# Patient Record
Sex: Female | Born: 1990 | Race: Black or African American | Hispanic: No | Marital: Single | Smoking: Never smoker
Health system: Southern US, Community
[De-identification: ages and names within clinical notes are randomized; demographics above are authoritative.]

## PROBLEM LIST (undated history)

## (undated) ENCOUNTER — Inpatient Hospital Stay (HOSPITAL_COMMUNITY): Payer: Self-pay

## (undated) ENCOUNTER — Inpatient Hospital Stay: Payer: Self-pay

## (undated) DIAGNOSIS — D561 Beta thalassemia: Principal | ICD-10-CM

## (undated) DIAGNOSIS — R51 Headache: Secondary | ICD-10-CM

## (undated) DIAGNOSIS — R519 Headache, unspecified: Secondary | ICD-10-CM

## (undated) DIAGNOSIS — Z3490 Encounter for supervision of normal pregnancy, unspecified, unspecified trimester: Secondary | ICD-10-CM

## (undated) DIAGNOSIS — Z8619 Personal history of other infectious and parasitic diseases: Secondary | ICD-10-CM

## (undated) DIAGNOSIS — D649 Anemia, unspecified: Secondary | ICD-10-CM

## (undated) HISTORY — PX: CERVICAL BIOPSY  W/ LOOP ELECTRODE EXCISION: SUR135

## (undated) HISTORY — DX: Anemia, unspecified: D64.9

## (undated) HISTORY — DX: Personal history of other infectious and parasitic diseases: Z86.19

## (undated) HISTORY — DX: Beta thalassemia: D56.1

## (undated) HISTORY — DX: Headache, unspecified: R51.9

## (undated) HISTORY — PX: NO PAST SURGERIES: SHX2092

## (undated) HISTORY — DX: Headache: R51

## (undated) HISTORY — DX: Encounter for supervision of normal pregnancy, unspecified, unspecified trimester: Z34.90

---
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
## 2011-05-25 ENCOUNTER — Emergency Department: Payer: Self-pay | Admitting: Emergency Medicine

## 2011-07-13 ENCOUNTER — Emergency Department: Payer: Self-pay | Admitting: *Deleted

## 2011-09-11 ENCOUNTER — Emergency Department: Payer: Self-pay | Admitting: Internal Medicine

## 2011-09-11 LAB — URINALYSIS, COMPLETE
Bacteria: NONE SEEN
Blood: NEGATIVE
Glucose,UR: NEGATIVE mg/dL (ref 0–75)
Leukocyte Esterase: NEGATIVE
Nitrite: NEGATIVE
RBC,UR: 1 /HPF (ref 0–5)
Specific Gravity: 1.034 (ref 1.003–1.030)
Squamous Epithelial: 2
WBC UR: NONE SEEN /HPF (ref 0–5)

## 2011-09-11 LAB — HCG, QUANTITATIVE, PREGNANCY: Beta Hcg, Quant.: 31 m[IU]/mL — ABNORMAL HIGH

## 2011-09-11 LAB — PREGNANCY, URINE: Pregnancy Test, Urine: POSITIVE m[IU]/mL

## 2012-06-27 ENCOUNTER — Emergency Department: Payer: Self-pay | Admitting: Emergency Medicine

## 2012-06-30 LAB — BETA STREP CULTURE(ARMC)

## 2014-02-28 LAB — OB RESULTS CONSOLE ANTIBODY SCREEN: Antibody Screen: NEGATIVE

## 2014-02-28 LAB — OB RESULTS CONSOLE HEPATITIS B SURFACE ANTIGEN: HEP B S AG: NEGATIVE

## 2014-02-28 LAB — OB RESULTS CONSOLE RUBELLA ANTIBODY, IGM: Rubella: IMMUNE

## 2014-02-28 LAB — OB RESULTS CONSOLE ABO/RH: RH TYPE: POSITIVE

## 2014-03-10 ENCOUNTER — Telehealth: Payer: Self-pay | Admitting: Hematology and Oncology

## 2014-03-10 ENCOUNTER — Other Ambulatory Visit: Payer: Self-pay | Admitting: Obstetrics and Gynecology

## 2014-03-10 DIAGNOSIS — E049 Nontoxic goiter, unspecified: Secondary | ICD-10-CM

## 2014-03-10 NOTE — Telephone Encounter (Signed)
S/W PATIENT BOYFRIEND AND GAVE NP APPT FOR 08/07 @ 1:30 W/DR. GORSUCH REFERRING DR. Maisie FusHOMAS HENLEY DX- THALASSEMIA

## 2014-03-24 ENCOUNTER — Ambulatory Visit: Payer: Self-pay | Admitting: Hematology and Oncology

## 2014-03-24 ENCOUNTER — Ambulatory Visit: Payer: Self-pay

## 2014-04-07 ENCOUNTER — Ambulatory Visit (INDEPENDENT_AMBULATORY_CARE_PROVIDER_SITE_OTHER): Payer: Medicaid Other | Admitting: Neurology

## 2014-04-07 ENCOUNTER — Encounter: Payer: Self-pay | Admitting: Neurology

## 2014-04-07 VITALS — BP 102/68 | HR 100 | Resp 16 | Ht 69.75 in | Wt 163.0 lb

## 2014-04-07 DIAGNOSIS — Z3490 Encounter for supervision of normal pregnancy, unspecified, unspecified trimester: Secondary | ICD-10-CM

## 2014-04-07 DIAGNOSIS — G43909 Migraine, unspecified, not intractable, without status migrainosus: Secondary | ICD-10-CM

## 2014-04-07 DIAGNOSIS — R519 Headache, unspecified: Secondary | ICD-10-CM | POA: Insufficient documentation

## 2014-04-07 DIAGNOSIS — R51 Headache: Secondary | ICD-10-CM

## 2014-04-07 MED ORDER — PROMETHAZINE HCL 12.5 MG PO TABS: ORAL_TABLET | ORAL | Status: DC

## 2014-04-07 NOTE — Progress Notes (Signed)
Provider:  Melvyn Novas, M D  Referring Provider: Bing Plume, MD Primary Care Physician:  No primary provider on file.  Chief Complaint  Patient presents with  . New Evaluation    Room 10  [redacted] weeks pregnant  . Headache    HPI:  Dorothy Young is a 23 y.o. single ,afro emrican right ahnded female , mother of 2, female , who is seen here as a referral  from  Her OB /GYN Dr. Ambrose Mantle for a headache  evaluation and treatment in the 17 th week of pregnancy.   Dorothy Young reports that this is her third pregnancy and she has not experienced headaches as part of the previous ones.  She noted in the weeks 8  Through 12 of this pregnancy an entirely new headaches, she has not had before. A left frontal throbbing and pulsating headache, waking her early in the morning . The pain lasts only 3-4 hours if untreated , if she takes medication ( tylenol) this  will resolved quickly. She has not vomited with these HA, but feels nauseated, dizzy and sensitive to lights and sounds. No visual aura.  These headaches have been present for the last 7 days 2 times.   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  She has not been bothered by a lot of  headache in the past.  On no medications.   Review of Systems: Out of a complete 14 system review, the patient complains of only the following symptoms, and all other reviewed systems are negative.  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    History   Social History  . Marital Status: Single    Spouse Name: N/A    Number of Children: 2  . Years of Education: 11   Occupational History  .  Unemployed   Social History Main Topics  . Smoking status: Never Smoker   . Smokeless tobacco: Never Used  . Alcohol Use: No  . Drug Use: No  . Sexual Activity: Not on file   Other Topics Concern  . Not on file   Social History Narrative   Patient is single and lives with her  mother.   Patient has two children.   Patient is not currently not working.   Patient has a high school education.   Patient is right-handed.   Patient drinks very little soda- not everyday.    Family History  Problem Relation Age of Onset  . Diabetes Paternal Grandmother     Past Medical History  Diagnosis Date  . Worsening headaches   . Pregnancy     History reviewed. No pertinent past surgical history.  No current outpatient prescriptions on file.   No current facility-administered medications for this visit.    Allergies as of 04/07/2014  . (Not on File)    Vitals: BP 102/68  Pulse 100  Resp 16  Ht 5' 9.75" (1.772 m)  Wt 163 lb (73.936 kg)  BMI 23.55 kg/m2 Last Weight:  Wt Readings from Last 1 Encounters:  04/07/14 163 lb (73.936 kg)   Last Height:   Ht Readings from Last 1 Encounters:  04/07/14 5' 9.75" (1.772 m)    Physical exam:  General: The patient is awake, alert and appears not in acute distress. The patient is well groomed. Head: Normocephalic, atraumatic. Neck is supple. Mallampati 1 , neck circumference:14  Cardiovascular:  Regular rate  and rhythm , without  murmurs or carotid bruit, and without distended neck veins. Respiratory: Lungs are clear to auscultation. Skin:  Without evidence of edema, or rash Trunk:  normal posture.  Neurologic exam : The patient is awake and alert, oriented to place and time.  Memory subjective  described as intact. There is a normal attention span & concentration ability.  Speech is fluent without  dysarthria, dysphonia or aphasia. Mood and affect are appropriate.  Cranial nerves: Pupils are equal and briskly reactive to light. Funduscopic exam without  evidence of pallor or edema.  Extraocular movements  in vertical and horizontal planes intact and without nystagmus.  Visual fields by finger perimetry are intact. Hearing to finger rub intact.  Facial sensation intact to fine touch. Facial motor strength is  symmetric and tongue and uvula move midline.  Tongue protrusion into either cheek is normal. Shoulder shrug is normal.   Motor exam:   Normal tone ,muscle bulk and symmetric  strength in all extremities. Sensory:  Fine touch, pinprick and vibration were tested in all extremities. Proprioception was normal. Coordination: Rapid alternating movements in the fingers/hands were normal.  Finger-to-nose maneuver  normal without evidence of ataxia, dysmetria or tremor.  Gait and station: Patient walks without assistive device and is able unassisted to climb up to the exam table.  Strength within normal limits. Stance is stable and normal. Tandem gait is unfragmented. Romberg testing is negative   Deep tendon reflexes: in the upper and lower extremities are symmetric and intact. Babinski maneuver response is  downgoing.   Assessment:  After physical and neurologic examination, review of laboratory studies, imaging, neurophysiology testing and pre-existing records, assessment is that of :  Headaches with migrainous character, in pregnancy 17 th. week gestation.  Patient is responding well to just tylenol at this point, will give her prn phenergan for nausea , needs no other pain medication at this point . Hydration and regular meals, small snacks recommended.  Sleep and rest .  Plan:  Treatment plan and additional workup :  Advise to hydrate,  Regular nutrition and exercise, routine sleep and wellness advise.  Tylenol is OK. No need for Tylenol 3.  phenergan prn use.   Porfirio Mylararmen Simmone Cape MD 04/07/2014

## 2014-04-07 NOTE — Patient Instructions (Addendum)
Medicines During Pregnancy During pregnancy, there are medicines that are either safe or unsafe to take. Medicines include prescriptions from your caregiver, over-the-counter medicines, topical creams applied to the skin, and all herbal substances. Medicines are put into either Class A, B, C, or D. Class A and B medicines have been shown to be safe in pregnancy. Class C medicines are also considered to be safe in pregnancy, but these medicines should only be used when necessary. Class D medicines should not be used at all in pregnancy. They can be harmful to a baby.  It is best to take as little medicine as possible while pregnant. However, some medicines are necessary to take for the mother and baby's health. Sometimes, it is more dangerous to stop taking certain medicines than to stay on them. This is often the case for people with long-term (chronic) conditions such as asthma, diabetes, or high blood pressure (hypertension). If you are pregnant and have a chronic illness, call your caregiver right away. Bring a list of your medicines and their doses to your appointments. If you are planning to become pregnant, schedule a doctor's appointment and discuss your medicines with your caregiver. Lastly, write down the phone number to your pharmacist. They can answer questions regarding a medicine's class and safety. They cannot give advice as to whether you should or should not be on a medicine.  SAFE AND UNSAFE MEDICINES There is a long list of medicines that are considered safe in pregnancy. Below is a shorter list. For specific medicines, ask your caregiver.  AllergyMedicines Loratadine, cetirizine, and chlorpheniramine are safe to take. Certain nasal steroid sprays are safe. Talk to your caregiver about specific brands that are safe. Analgesics Acetaminophen and acetaminophen with codeine are safe to take. All other nonsteroidal anti-inflammatory drugs (NSAIDS) are not safe. This includes ibuprofen.   Antacids Many over-the-counter antacids are safe to take. Talk to your caregiver about specific brands that are safe. Famotidine, ranitidine, and lansoprazole are safe. Omepresole is considered safe to take in the second trimester. Antibiotic Medicines There are several antibiotics to avoid. These include, but are not limited to, tetracyline, quinolones, and sulfa medications. Talk to your caregiver before taking any antibiotic.  Antihistamines Talk to your caregiver about specific brands that are safe.  Asthma Medicines Most asthma steroid inhalers are safe to take. Talk to your caregiver for specific details. Calcium Calcium supplements are safe to take. Do not take oyster shell calcium.  Cough and Cold Medicines It is safe to take products with guaifenesin or dextromethorphan. Talk to your caregiver about specific brands that are safe. It is not safe to take products that contain aspirin or ibuprofen. Decongestant Medicines Pseudoephedrine-containing products are safe to take in the second and third trimester.  Depression Medicines Talk about these medicines with your caregiver.  Antidiarrheal Medicines It is safe to take loperamide. Talk to your caregiver about specific brands that are safe. It is not safe to take any antidiarrheal medicine that contains bismuth. Eyedrops Allergy eyedrops should be limited.  Iron It is safe to use certain iron-containing medicines for anemia in pregnancy. They require a prescription.  Antinausea Medicines It is safe to take doxylamine and vitamin B6 as directed. There are other prescription medicines available, if needed.  Sleep aids It is safe to take diphenhydramine and acetaminophen with diphenhydramine.  Steroids Hydrocortisone creams are safe to use as directed. Oral steroids require a prescription. It is not safe to take any hemorrhoid cream with pramoxine or phenylephrine. Stool  softener It is safe to take stool softener  medicines. Avoid daily or prolonged use of stool softeners. Thyroid Medicine It is important to stay on this thyroid medicine. It needs to be followed by your caregiver.  Vaginal Medicines Your caregiver will prescribe a medicine to you if you have a vaginal infection. Certain antifungal medicines are safe to use if you have a sexually transmitted infection (STI). Talk to your caregiver.  Document Released: 08/04/2005 Document Revised: 10/27/2011 Document Reviewed: 08/05/2011 [facility] Patient Information 2015 [place], [place]. This information is not intended to replace advice given to you by your health care provider. Make sure you discuss any questions you have with your health care provider. Promethazine tablets What is this medicine? PROMETHAZINE (proe METH a zeen) is an antihistamine. It is used to treat allergic reactions and to treat or prevent nausea and vomiting from illness or motion sickness. It is also used to make you sleep before surgery, and to help treat pain or nausea after surgery. This medicine may be used for other purposes; ask your health care provider or pharmacist if you have questions. COMMON BRAND NAME(S): Phenergan What should I tell my health care provider before I take this medicine? They need to know if you have any of these conditions: -glaucoma -high blood pressure or heart disease -kidney disease -liver disease -lung or breathing disease, like asthma -prostate trouble -pain or difficulty passing urine -seizures -an unusual or allergic reaction to promethazine or phenothiazines, other medicines, foods, dyes, or preservatives -pregnant or trying to get pregnant -breast-feeding How should I use this medicine? Take this medicine by mouth with a glass of water. Follow the directions on the prescription label. Take your doses at regular intervals. Do not take your medicine more often than directed. Talk to your pediatrician regarding the use of this medicine in  children. Special care may be needed. This medicine should not be given to infants and children younger than 74 years old. Overdosage: If you think you have taken too much of this medicine contact a poison control center or emergency room at once. NOTE: This medicine is only for you. Do not share this medicine with others. What if I miss a dose? If you miss a dose, take it as soon as you can. If it is almost time for your next dose, take only that dose. Do not take double or extra doses. What may interact with this medicine? Do not take this medicine with any of the following medications: -cisapride -dofetilide -dronedarone -MAOIs like Carbex, Eldepryl, Marplan, Nardil, Parnate -pimozide -quinidine, including dextromethorphan; quinidine -thioridazine -ziprasidone This medicine may also interact with the following medications: -certain medicines for depression, anxiety, or psychotic disturbances -certain medicines for anxiety or sleep -certain medicines for seizures like carbamazepine, phenobarbital, phenytoin -certain medicines for movement abnormalities as in Parkinson's disease, or for gastrointestinal problems -epinephrine -medicines for allergies or colds -muscle relaxants -narcotic medicines for pain -other medicines that prolong the QT interval (cause an abnormal heart rhythm) -tramadol -trimethobenzamide This list may not describe all possible interactions. Give your health care provider a list of all the medicines, herbs, non-prescription drugs, or dietary supplements you use. Also tell them if you smoke, drink alcohol, or use illegal drugs. Some items may interact with your medicine. What should I watch for while using this medicine? Tell your doctor or health care professional if your symptoms do not start to get better in 1 to 2 days. You may get drowsy or dizzy. Do not drive, use  machinery, or do anything that needs mental alertness until you know how this medicine affects  you. To reduce the risk of dizzy or fainting spells, do not stand or sit up quickly, especially if you are an older patient. Alcohol may increase dizziness and drowsiness. Avoid alcoholic drinks. Your mouth may get dry. Chewing sugarless gum or sucking hard candy, and drinking plenty of water may help. Contact your doctor if the problem does not go away or is severe. This medicine may cause dry eyes and blurred vision. If you wear contact lenses you may feel some discomfort. Lubricating drops may help. See your eye doctor if the problem does not go away or is severe. This medicine can make you more sensitive to the sun. Keep out of the sun. If you cannot avoid being in the sun, wear protective clothing and use sunscreen. Do not use sun lamps or tanning beds/booths. If you are diabetic, check your blood-sugar levels regularly. What side effects may I notice from receiving this medicine? Side effects that you should report to your doctor or health care professional as soon as possible: -blurred vision -irregular heartbeat, palpitations or chest pain -muscle or facial twitches -pain or difficulty passing urine -seizures -skin rash -slowed or shallow breathing -unusual bleeding or bruising -yellowing of the eyes or skin Side effects that usually do not require medical attention (report to your doctor or health care professional if they continue or are bothersome): -headache -nightmares, agitation, nervousness, excitability, not able to sleep (these are more likely in children) -stuffy nose This list may not describe all possible side effects. Call your doctor for medical advice about side effects. You may report side effects to FDA at 1-800-FDA-1088. Where should I keep my medicine? Keep out of the reach of children. Store at room temperature, between 20 and 25 degrees C (68 and 77 degrees F). Protect from light. Throw away any unused medicine after the expiration date. NOTE: This sheet is a  summary. It may not cover all possible information. If you have questions about this medicine, talk to your doctor, pharmacist, or health care provider.  2015, [employer]vier/Gold Standard. (2013-04-05 15:04:46)

## 2014-05-02 ENCOUNTER — Telehealth: Payer: Self-pay | Admitting: Hematology and Oncology

## 2014-05-02 NOTE — Telephone Encounter (Signed)
S/W PT IN REF TO NP APPT. ON 05/18/14@1 :30 REFERRING DR HENLEY DX-THALASSEMIA

## 2014-05-05 ENCOUNTER — Ambulatory Visit
Admission: RE | Admit: 2014-05-05 | Discharge: 2014-05-05 | Disposition: A | Discharge status: Home / Self Care | Payer: Medicaid Other | Source: Ambulatory Visit | Attending: Obstetrics and Gynecology | Admitting: Obstetrics and Gynecology

## 2014-05-05 DIAGNOSIS — E049 Nontoxic goiter, unspecified: Secondary | ICD-10-CM

## 2014-05-18 ENCOUNTER — Ambulatory Visit (HOSPITAL_BASED_OUTPATIENT_CLINIC_OR_DEPARTMENT_OTHER): Payer: Medicaid Other

## 2014-05-18 ENCOUNTER — Ambulatory Visit (HOSPITAL_BASED_OUTPATIENT_CLINIC_OR_DEPARTMENT_OTHER): Payer: Medicaid Other | Admitting: Hematology and Oncology

## 2014-05-18 ENCOUNTER — Encounter: Payer: Self-pay | Admitting: Hematology and Oncology

## 2014-05-18 ENCOUNTER — Ambulatory Visit: Payer: Medicaid Other

## 2014-05-18 ENCOUNTER — Telehealth: Payer: Self-pay | Admitting: Hematology and Oncology

## 2014-05-18 VITALS — BP 110/62 | HR 94 | Temp 98.2°F | Resp 18 | Ht 69.75 in | Wt 167.3 lb

## 2014-05-18 DIAGNOSIS — D561 Beta thalassemia: Secondary | ICD-10-CM

## 2014-05-18 DIAGNOSIS — O99012 Anemia complicating pregnancy, second trimester: Secondary | ICD-10-CM

## 2014-05-18 DIAGNOSIS — D563 Thalassemia minor: Secondary | ICD-10-CM | POA: Insufficient documentation

## 2014-05-18 DIAGNOSIS — O99019 Anemia complicating pregnancy, unspecified trimester: Secondary | ICD-10-CM | POA: Insufficient documentation

## 2014-05-18 HISTORY — DX: Beta thalassemia: D56.1

## 2014-05-18 LAB — CBC & DIFF AND RETIC
BASO%: 0.3 % (ref 0.0–2.0)
BASOS ABS: 0 10*3/uL (ref 0.0–0.1)
EOS ABS: 0.2 10*3/uL (ref 0.0–0.5)
EOS%: 3.1 % (ref 0.0–7.0)
HEMATOCRIT: 33.2 % — AB (ref 34.8–46.6)
HEMOGLOBIN: 10.9 g/dL — AB (ref 11.6–15.9)
IMMATURE RETIC FRACT: 9 % (ref 1.60–10.00)
LYMPH#: 1.6 10*3/uL (ref 0.9–3.3)
LYMPH%: 25.8 % (ref 14.0–49.7)
MCH: 25.1 pg (ref 25.1–34.0)
MCHC: 32.8 g/dL (ref 31.5–36.0)
MCV: 76.3 fL — ABNORMAL LOW (ref 79.5–101.0)
MONO#: 0.7 10*3/uL (ref 0.1–0.9)
MONO%: 11.8 % (ref 0.0–14.0)
NEUT#: 3.7 10*3/uL (ref 1.5–6.5)
NEUT%: 59 % (ref 38.4–76.8)
PLATELETS: 148 10*3/uL (ref 145–400)
RBC: 4.35 10*6/uL (ref 3.70–5.45)
RDW: 15.5 % — ABNORMAL HIGH (ref 11.2–14.5)
Retic %: 1.12 % (ref 0.70–2.10)
Retic Ct Abs: 48.72 10*3/uL (ref 33.70–90.70)
WBC: 6.2 10*3/uL (ref 3.9–10.3)

## 2014-05-18 LAB — IRON AND TIBC CHCC
%SAT: 38 % (ref 21–57)
IRON: 125 ug/dL (ref 41–142)
TIBC: 330 ug/dL (ref 236–444)
UIBC: 205 ug/dL (ref 120–384)

## 2014-05-18 LAB — MORPHOLOGY: PLT EST: ADEQUATE

## 2014-05-18 LAB — FERRITIN CHCC: Ferritin: 22 ng/ml (ref 9–269)

## 2014-05-18 NOTE — Assessment & Plan Note (Signed)
Clinically she is not symptomatic. I would check an iron panel now and then closer to the third trimester in case she may need iron infusion.

## 2014-05-18 NOTE — Progress Notes (Signed)
[facility] CONSULT NOTE  Patient Care Team: No Pcp Per Patient as PCP - General (General Practice) Bing Plumehomas F Henley, MD as Referring Physician (Obstetrics and Gynecology) Artis DelayNi Paullette Mckain, MD as Consulting Physician (Hematology and Oncology)  CHIEF COMPLAINTS/PURPOSE OF CONSULTATION:  Beta thalassemia  HISTORY OF PRESENTING ILLNESS:  Dorothy Young 23 y.o. 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 She is currently pregnant with her third child and is [redacted] weeks pregnant. She had 2 prior normal pregnancies and delivery. She was found to have abnormal CBC from routine prenatal screening test. She denies recent chest pain on exertion, shortness of breath on minimal exertion, pre-syncopal episodes, or palpitations. She had not noticed any recent bleeding such as epistaxis, hematuria or hematochezia The patient denies over the counter NSAID ingestion. She is not on antiplatelets agents. She never donated blood or received blood transfusion  MEDICAL HISTORY:  Past Medical History  Diagnosis Date  . Worsening headaches   . Pregnancy   . Anemia     thalassemia Beta minor  . 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 No pertinent past surgical history.  SOCIAL HISTORY: History   Social History  . Marital Status: Single    Spouse Name: N/A    Number of Children: 2  . Years of Education: 11   Occupational History  .  Unemployed   Social History Main Topics  . Smoking status: Never Smoker   . Smokeless tobacco: Never Used  . Alcohol Use: No  . Drug Use: No  . Sexual Activity: Not on file   Other Topics Concern  . Not on file   Social History Narrative   Patient is single and lives with her mother.   Patient has two children.   Patient is not currently not working.   Patient has a high school education.   Patient is right-handed.   Patient drinks very little soda- not everyday.    FAMILY HISTORY: Family History  Problem  Relation Age of Onset  . Diabetes Paternal Grandmother   . Cancer Paternal Aunt     unknown cancer    ALLERGIES:  has no allergies on file.  MEDICATIONS:  Current Outpatient Prescriptions  Medication Sig Dispense Refill  . Prenatal Vit-Fe Fumarate-FA (PRENATAL MULTIVITAMIN) TABS tablet Take 1 tablet by mouth daily at 12 noon.      . promethazine (PHENERGAN) 12.5 MG tablet Prn po for nausea.  30 tablet  0   No current facility-administered medications for this visit.    REVIEW OF SYSTEMS:   Constitutional: Denies fevers, chills or abnormal night sweats Eyes: Denies blurriness of vision, double vision or watery eyes Ears, nose, mouth, throat, and face: Denies mucositis or sore throat Respiratory: Denies cough, dyspnea or wheezes Cardiovascular: Denies palpitation, chest discomfort or lower extremity swelling Gastrointestinal:  Denies nausea, heartburn or change in bowel habits Skin: Denies abnormal skin rashes Lymphatics: Denies new lymphadenopathy or easy bruising Neurological:Denies numbness, tingling or new weaknesses Behavioral/Psych: Mood is stable, no new changes  All other systems were reviewed with the patient and are negative.  PHYSICAL EXAMINATION: ECOG PERFORMANCE STATUS: 0 - Asymptomatic  Filed Vitals:   05/18/14 1404  BP: 110/62  Pulse: 94  Temp: 98.2 F (36.8 C)  Resp: 18   Filed Weights   05/18/14 1404  Weight: 167 lb 4.8 oz (75.887 kg)    GENERAL:alert, no distress and comfortable SKIN: skin color, texture, turgor are normal, no rashes or significant lesions EYES:  normal, conjunctiva are pink and non-injected, sclera clear OROPHARYNX:no exudate, no erythema and lips, buccal mucosa, and tongue normal  NECK: supple, thyroid normal size, non-tender, without nodularity LYMPH:  no palpable lymphadenopathy in the cervical, axillary or inguinal LUNGS: clear to auscultation and percussion with normal breathing effort HEART: regular rate & rhythm and no  murmurs and no lower extremity edema ABDOMEN:abdomen soft, non-tender and normal bowel sounds Musculoskeletal:no cyanosis of digits and no clubbing  PSYCH: alert & oriented x 3 with fluent speech NEURO: no focal motor/sensory deficits  LABORATORY DATA:  I have reviewed the data as listed Recent Results (from the past 2160 hour(s))  CBC & DIFF AND RETIC     Status: Abnormal   Collection Time    05/18/14  2:57 PM      Result Value Ref Range   WBC 6.2  3.9 - 10.3 10e3/uL   NEUT# 3.7  1.5 - 6.5 10e3/uL   HGB 10.9 (*) 11.6 - 15.9 g/dL   HCT 11.9 (*) 14.7 - 82.9 %   Platelets 148  145 - 400 10e3/uL   MCV 76.3 (*) 79.5 - 101.0 fL   MCH 25.1  25.1 - 34.0 pg   MCHC 32.8  31.5 - 36.0 g/dL   RBC 5.62  1.30 - 8.65 10e6/uL   RDW 15.5 (*) 11.2 - 14.5 %   lymph# 1.6  0.9 - 3.3 10e3/uL   MONO# 0.7  0.1 - 0.9 10e3/uL   Eosinophils Absolute 0.2  0.0 - 0.5 10e3/uL   Basophils Absolute 0.0  0.0 - 0.1 10e3/uL   NEUT% 59.0  38.4 - 76.8 %   LYMPH% 25.8  14.0 - 49.7 %   MONO% 11.8  0.0 - 14.0 %   EOS% 3.1  0.0 - 7.0 %   BASO% 0.3  0.0 - 2.0 %   Retic % 1.12  0.70 - 2.10 %   Retic Ct Abs 48.72  33.70 - 90.70 10e3/uL   Immature Retic Fract 9.00  1.60 - 10.00 %  IRON AND TIBC [facility]     Status: None   Collection Time    05/18/14  2:57 PM      Result Value Ref Range   Iron 125  41 - 142 ug/dL   TIBC 784  696 - 295 ug/dL   UIBC 284  132 - 440 ug/dL   %SAT 38  21 - 57 %  FERRITIN [facility]     Status: None   Collection Time    05/18/14  2:57 PM      Result Value Ref Range   Ferritin 22  9 - 269 ng/ml  MORPHOLOGY     Status: None   Collection Time    05/18/14  2:57 PM      Result Value Ref Range   Polychromasia Slight  Slight   Ovalocytes Few  Negative   White Cell Comments Variant Lymphs, rare metamyelocte     PLT EST Adequate  Adequate   Platelet Morphology Large Platelets  Within Normal Limits    RADIOGRAPHIC STUDIES: I have personally reviewed the radiological images as listed and agreed  with the findings in the report. US Soft Tissue Head/neck  05/05/2014   CLINICAL DATA:  Goiter.  EXAM: THYROID ULTRASOUND  TECHNIQUE: Ultrasound examination of the thyroid gland and adjacent soft tissues was performed.  COMPARISON:  None.  FINDINGS: Right thyroid lobe  Measurements: 5.2 cm x 2.4 cm x 2.6 cm.  No nodules visualized.  Left thyroid lobe  Measurements: 5.8 cm x 2.1 cm x 2.5 cm. Tiny cystic nodule in the midpole measuring 3 mm. No other nodules visualized.  Isthmus  Thickness: 7 mm.  No nodules visualized.  Lymphadenopathy  None visualized.  IMPRESSION: 1. Mild thyroid enlargement. 2. Tiny cystic nodule measuring 3 mm in the left lobe. No other nodules. 3. No other abnormalities.   Electronically Signed   By: Amie Portland M.D.   On: 05/05/2014 16:58    ASSESSMENT & PLAN:  Thalassemia, beta She is not symptomatic. I recommend she continues on prenatal vitamin. I discussed with her briefly that is a 50% chance that her children may inherit the gene but will likely be asymptomatic unless her partner is also a carrier.  Anemia in pregnancy Clinically she is not symptomatic. I would check an iron panel now and then closer to the third trimester in case she may need iron infusion.    All questions were answered. The patient knows to call the clinic with any problems, questions or concerns. I spent 30 minutes counseling the patient face to face. The total time spent in the appointment was 40 minutes and more than 50% was on counseling.     [facility], Aahan Marques, MD 05/18/2014 6:49 PM

## 2014-05-18 NOTE — Progress Notes (Signed)
Checked in new pt with no financial concerns. °

## 2014-05-18 NOTE — Assessment & Plan Note (Signed)
She is not symptomatic. I recommend she continues on prenatal vitamin. I discussed with her briefly that is a 50% chance that her children may inherit the gene but will likely be asymptomatic unless her partner is also a carrier. 

## 2014-06-15 LAB — OB RESULTS CONSOLE HIV ANTIBODY (ROUTINE TESTING): HIV: NONREACTIVE

## 2014-06-15 LAB — OB RESULTS CONSOLE RPR: RPR: NONREACTIVE

## 2014-06-19 ENCOUNTER — Encounter: Payer: Self-pay | Admitting: Hematology and Oncology

## 2014-08-04 ENCOUNTER — Ambulatory Visit (HOSPITAL_BASED_OUTPATIENT_CLINIC_OR_DEPARTMENT_OTHER): Payer: Medicaid Other | Admitting: Hematology and Oncology

## 2014-08-04 ENCOUNTER — Other Ambulatory Visit (HOSPITAL_BASED_OUTPATIENT_CLINIC_OR_DEPARTMENT_OTHER): Payer: Medicaid Other

## 2014-08-04 ENCOUNTER — Encounter: Payer: Self-pay | Admitting: Hematology and Oncology

## 2014-08-04 VITALS — BP 105/60 | HR 85 | Temp 98.5°F | Resp 20 | Ht 69.75 in | Wt 176.4 lb

## 2014-08-04 DIAGNOSIS — O99013 Anemia complicating pregnancy, third trimester: Secondary | ICD-10-CM

## 2014-08-04 DIAGNOSIS — D561 Beta thalassemia: Secondary | ICD-10-CM

## 2014-08-04 LAB — CBC & DIFF AND RETIC
BASO%: 0.2 % (ref 0.0–2.0)
Basophils Absolute: 0 10*3/uL (ref 0.0–0.1)
EOS ABS: 0.1 10*3/uL (ref 0.0–0.5)
EOS%: 0.9 % (ref 0.0–7.0)
HEMATOCRIT: 34.1 % — AB (ref 34.8–46.6)
HEMOGLOBIN: 11.1 g/dL — AB (ref 11.6–15.9)
Immature Retic Fract: 11 % — ABNORMAL HIGH (ref 1.60–10.00)
LYMPH%: 20.3 % (ref 14.0–49.7)
MCH: 25.2 pg (ref 25.1–34.0)
MCHC: 32.6 g/dL (ref 31.5–36.0)
MCV: 77.3 fL — ABNORMAL LOW (ref 79.5–101.0)
MONO#: 0.8 10*3/uL (ref 0.1–0.9)
MONO%: 9 % (ref 0.0–14.0)
NEUT%: 69.6 % (ref 38.4–76.8)
NEUTROS ABS: 5.9 10*3/uL (ref 1.5–6.5)
PLATELETS: 152 10*3/uL (ref 145–400)
RBC: 4.41 10*6/uL (ref 3.70–5.45)
RDW: 15.6 % — ABNORMAL HIGH (ref 11.2–14.5)
RETIC %: 1.38 % (ref 0.70–2.10)
Retic Ct Abs: 60.86 10*3/uL (ref 33.70–90.70)
WBC: 8.5 10*3/uL (ref 3.9–10.3)
lymph#: 1.7 10*3/uL (ref 0.9–3.3)

## 2014-08-04 LAB — MORPHOLOGY: PLT EST: ADEQUATE

## 2014-08-04 NOTE — Progress Notes (Signed)
[facility] OFFICE PROGRESS NOTE  No PCP Per Patient SUMMARY OF HEMATOLOGIC HISTORY: Beta thalassemia She is here because of diagnosis of thalassemia. She is currently pregnant with her third child and is [redacted] weeks pregnant. She had 2 prior normal pregnancies and delivery. She was found to have abnormal CBC from routine prenatal screening test. She denies recent chest pain on exertion, shortness of breath on minimal exertion, pre-syncopal episodes, or palpitations. She had not noticed any recent bleeding such as epistaxis, hematuria or hematochezia The patient denies over the counter NSAID ingestion. She is not on antiplatelets agents. She never donated blood or received blood transfusion INTERVAL HISTORY: Dorothy Young Dietzman 23 y.o. female returns for further follow-up. She feels fine. The patient denies any recent signs or symptoms of bleeding such as spontaneous epistaxis, hematuria or hematochezia. She tolerated iron supplement well.  I have reviewed the past medical history, past surgical history, social history and family history with the patient and they are unchanged from previous note.  ALLERGIES:  has No Known Allergies.  MEDICATIONS:  Current Outpatient Prescriptions  Medication Sig Dispense Refill  . Prenatal Vit-Fe Fumarate-FA (PRENATAL MULTIVITAMIN) TABS tablet Take 1 tablet by mouth daily at 12 noon.    . promethazine (PHENERGAN) 12.5 MG tablet Prn po for nausea. 30 tablet 0   No current facility-administered medications for this visit.     REVIEW OF SYSTEMS:   Constitutional: Denies fevers, chills or night sweats Eyes: Denies blurriness of vision Ears, nose, mouth, throat, and face: Denies mucositis or sore throat Respiratory: Denies cough, dyspnea or wheezes Cardiovascular: Denies palpitation, chest discomfort or lower extremity swelling Gastrointestinal:  Denies nausea, heartburn or change in bowel habits Skin: Denies abnormal skin  rashes Lymphatics: Denies new lymphadenopathy or easy bruising Neurological:Denies numbness, tingling or new weaknesses Behavioral/Psych: Mood is stable, no new changes  All other systems were reviewed with the patient and are negative.  PHYSICAL EXAMINATION: ECOG PERFORMANCE STATUS: 0 - Asymptomatic  Filed Vitals:   08/04/14 1455  BP: 105/60  Pulse: 85  Temp: 98.5 F (36.9 C)  Resp: 20   Filed Weights   08/04/14 1455  Weight: 176 lb 6.4 oz (80.015 kg)    GENERAL:alert, no distress and comfortable SKIN: skin color, texture, turgor are normal, no rashes or significant lesions EYES: normal, Conjunctiva are pink and non-injected, sclera clear Musculoskeletal:no cyanosis of digits and no clubbing  NEURO: alert & oriented x 3 with fluent speech, no focal motor/sensory deficits  LABORATORY DATA:  I have reviewed the data as listed Results for orders placed or performed in visit on 08/04/14 (from the past 48 hour(s))  CBC & Diff and Retic     Status: Abnormal   Collection Time: 08/04/14  2:35 PM  Result Value Ref Range   WBC 8.5 3.9 - 10.3 10e3/uL   NEUT# 5.9 1.5 - 6.5 10e3/uL   HGB 11.1 (L) 11.6 - 15.9 g/dL   HCT 40.934.1 (L) 81.134.8 - 91.446.6 %   Platelets 152 145 - 400 10e3/uL   MCV 77.3 (L) 79.5 - 101.0 fL   MCH 25.2 25.1 - 34.0 pg   MCHC 32.6 31.5 - 36.0 g/dL   RBC 7.824.41 9.563.70 - 2.135.45 10e6/uL   RDW 15.6 (H) 11.2 - 14.5 %   lymph# 1.7 0.9 - 3.3 10e3/uL   MONO# 0.8 0.1 - 0.9 10e3/uL   Eosinophils Absolute 0.1 0.0 - 0.5 10e3/uL   Basophils Absolute 0.0 0.0 - 0.1 10e3/uL   NEUT% 69.6  38.4 - 76.8 %   LYMPH% 20.3 14.0 - 49.7 %   MONO% 9.0 0.0 - 14.0 %   EOS% 0.9 0.0 - 7.0 %   BASO% 0.2 0.0 - 2.0 %   Retic % 1.38 0.70 - 2.10 %   Retic Ct Abs 60.86 33.70 - 90.70 10e3/uL   Immature Retic Fract 11.00 (H) 1.60 - 10.00 %  Morphology     Status: None   Collection Time: 08/04/14  2:35 PM  Result Value Ref Range   Polychromasia Slight Slight   Tear Drop Cells Few Negative   White Cell  Comments C/W auto diff    PLT EST Adequate Adequate   Platelet Morphology Few Large Platelets Within Normal Limits    Lab Results  Component Value Date   WBC 8.5 08/04/2014   HGB 11.1* 08/04/2014   HCT 34.1* 08/04/2014   MCV 77.3* 08/04/2014   PLT 152 08/04/2014    ASSESSMENT & PLAN:  Thalassemia, beta She is not symptomatic. I recommend she continues on prenatal vitamin. I discussed with her briefly that is a 50% chance that her children may inherit the gene but will likely be asymptomatic unless her partner is also a carrier.  Anemia in pregnancy Clinically she is not symptomatic. Her hemoglobin is higher than previous visit. I recommend she continue on iron supplement.   All questions were answered. The patient knows to call the clinic with any problems, questions or concerns. No barriers to learning was detected.  I spent 15 minutes counseling the patient face to face. The total time spent in the appointment was 20 minutes and more than 50% was on counseling.     [facility] : [facility] HospitalGORSUCH, Tekeshia Klahr, MD 08/04/2014 3:09 PM

## 2014-08-04 NOTE — Assessment & Plan Note (Signed)
Clinically she is not symptomatic. Her hemoglobin is higher than previous visit. I recommend she continue on iron supplement.

## 2014-08-04 NOTE — Assessment & Plan Note (Signed)
She is not symptomatic. I recommend she continues on prenatal vitamin. I discussed with her briefly that is a 50% chance that her children may inherit the gene but will likely be asymptomatic unless her partner is also a carrier.

## 2014-08-07 LAB — IRON AND TIBC CHCC
%SAT: 20 % — ABNORMAL LOW (ref 21–57)
IRON: 75 ug/dL (ref 41–142)
TIBC: 368 ug/dL (ref 236–444)
UIBC: 293 ug/dL (ref 120–384)

## 2014-08-07 LAB — FERRITIN CHCC: FERRITIN: 26 ng/mL (ref 9–269)

## 2014-08-08 ENCOUNTER — Encounter (HOSPITAL_COMMUNITY): Payer: Self-pay | Admitting: *Deleted

## 2014-08-08 ENCOUNTER — Inpatient Hospital Stay (HOSPITAL_COMMUNITY)
Admission: AD | Admit: 2014-08-08 | Discharge: 2014-08-08 | Disposition: A | Discharge status: Home / Self Care | Payer: Medicaid Other | Source: Ambulatory Visit | Attending: Obstetrics and Gynecology | Admitting: Obstetrics and Gynecology

## 2014-08-08 DIAGNOSIS — R109 Unspecified abdominal pain: Secondary | ICD-10-CM | POA: Insufficient documentation

## 2014-08-08 DIAGNOSIS — Z3A34 34 weeks gestation of pregnancy: Secondary | ICD-10-CM | POA: Diagnosis not present

## 2014-08-08 DIAGNOSIS — O9989 Other specified diseases and conditions complicating pregnancy, childbirth and the puerperium: Secondary | ICD-10-CM | POA: Diagnosis not present

## 2014-08-08 DIAGNOSIS — O26899 Other specified pregnancy related conditions, unspecified trimester: Secondary | ICD-10-CM

## 2014-08-08 LAB — URINALYSIS, ROUTINE W REFLEX MICROSCOPIC
Bilirubin Urine: NEGATIVE
Glucose, UA: NEGATIVE mg/dL
Hgb urine dipstick: NEGATIVE
Ketones, ur: NEGATIVE mg/dL
NITRITE: NEGATIVE
Protein, ur: NEGATIVE mg/dL
SPECIFIC GRAVITY, URINE: 1.015 (ref 1.005–1.030)
UROBILINOGEN UA: 4 mg/dL — AB (ref 0.0–1.0)
pH: 7 (ref 5.0–8.0)

## 2014-08-08 LAB — URINE MICROSCOPIC-ADD ON

## 2014-08-08 NOTE — MAU Note (Signed)
Having abd pains all day today.  Went to dr, was told if things got worse, to go back to office.  Hasn't gotten worse, is just bad.  Denies bleeding or leaking, no hx of PTL

## 2014-08-08 NOTE — MAU Provider Note (Signed)
History     CSN: [id number]  Arrival date and time: 08/08/14 1515   First Provider Initiated Contact with Patient 08/08/14 1630      Chief Complaint  Patient presents with  . Abdominal Pain   HPI Pt is [id number] pregnant [id number] and c/o right sided pain that hurt when she walked- lasted 2 to 3 hours-  Pt had an appointment with Dr. Ellyn HackBovard early this afternoon and mentioned the pain and was told to go home and rest- pt was driving and pain became worse, so she came to the hospital.  Pt denies vaginal discharge, leakage of fluid or spotting/bleeding, Constipation or diarrhea Past Medical History  Diagnosis Date  . Worsening headaches   . Pregnancy   . Anemia     thalassemia Beta minor  . Thalassemia, beta 05/18/2014    Past Surgical History  Procedure Laterality Date  . No past surgeries      Family History  Problem Relation Age of Onset  . Diabetes Paternal Grandmother   . Cancer Paternal Aunt     unknown cancer    History  Substance Use Topics  . Smoking status: Never Smoker   . Smokeless tobacco: Never Used  . Alcohol Use: No    Allergies: No Known Allergies  Prescriptions prior to admission  Medication Sig Dispense Refill Last Dose  . Prenatal Vit-Fe Fumarate-FA (PRENATAL MULTIVITAMIN) TABS tablet Take 1 tablet by mouth daily at 12 noon.   08/08/2014 at Unknown time  . promethazine (PHENERGAN) 12.5 MG tablet Prn po for nausea. 30 tablet 0 Past Month at Unknown time    Review of Systems  Constitutional: Negative for fever and chills.  Gastrointestinal: Positive for abdominal pain. Negative for nausea and vomiting.  Genitourinary: Negative for dysuria.   Physical Exam   Blood pressure 127/80, pulse 108, temperature 99 F (37.2 C), temperature source Oral, resp. rate 18, weight 174 lb (78.926 kg).  Physical Exam  Nursing note and vitals reviewed. Constitutional: She is oriented to person, place, and time. She appears well-developed and well-nourished. No  distress.  HENT:  Head: Normocephalic.  Eyes: Pupils are equal, round, and reactive to light.  Neck: Normal range of motion.  Cardiovascular: Normal rate.   Respiratory: Effort normal.  GI: Soft. She exhibits no distension. There is no tenderness. There is no rebound and no guarding.  Genitourinary:  Cervix closed posterior  Musculoskeletal: Normal range of motion.  Neurological: She is alert and oriented to person, place, and time.  Skin: Skin is warm and dry.  Psychiatric: She has a normal mood and affect.    MAU Course  Procedures Results for orders placed or performed during the hospital encounter of 08/08/14 (from the past 24 hour(s))  Urinalysis, Routine w reflex microscopic     Status: Abnormal   Collection Time: 08/08/14  3:35 PM  Result Value Ref Range   Color, Urine YELLOW YELLOW   APPearance CLEAR CLEAR   Specific Gravity, Urine 1.015 1.005 - 1.030   pH 7.0 5.0 - 8.0   Glucose, UA NEGATIVE NEGATIVE mg/dL   Hgb urine dipstick NEGATIVE NEGATIVE   Bilirubin Urine NEGATIVE NEGATIVE   Ketones, ur NEGATIVE NEGATIVE mg/dL   Protein, ur NEGATIVE NEGATIVE mg/dL   Urobilinogen, UA 4.0 (H) 0.0 - 1.0 mg/dL   Nitrite NEGATIVE NEGATIVE   Leukocytes, UA SMALL (A) NEGATIVE  Urine microscopic-add on     Status: Abnormal   Collection Time: 08/08/14  3:35 PM  Result Value Ref Range  Squamous Epithelial / LPF FEW (A) RARE   WBC, UA 0-2 <3 WBC/hpf   RBC / HPF 0-2 <3 RBC/hpf   Bacteria, UA RARE RARE  NST reactive; pain resolved  Discussed with Dr. Jackelyn KnifeMeisinger Assessment and Plan  Abdominal pain in pregnancy F/u with OB appointment- return sooner if increase in pain  Dorothy Young 08/08/2014, 4:32 PM

## 2014-08-09 NOTE — Progress Notes (Signed)
FHT from 12-22 reviewed.  Reactive NST, irreg ctx.

## 2014-08-15 LAB — OB RESULTS CONSOLE GC/CHLAMYDIA
Chlamydia: NEGATIVE
Gonorrhea: NEGATIVE

## 2014-08-15 LAB — OB RESULTS CONSOLE GBS: STREP GROUP B AG: NEGATIVE

## 2014-08-18 NOTE — L&D Delivery Note (Signed)
Delivery Note At 5:07 PM a viable female was delivered via Vaginal, Spontaneous Delivery (Presentation: face; Mentum Anterior).  APGAR: 8, 9; weight  pending.   Placenta status: Intact, Spontaneous.  Cord: 3 vessels with the following complications: None.  Anesthesia: None  Episiotomy: None Lacerations: Vaginal Suture Repair: 3.0 vicryl rapide Est. Blood Loss (mL): 300  Mom to postpartum.  Baby to Couplet care / Skin to Skin.  Lilybelle Mayeda D 08/30/2014, 5:28 PM

## 2014-08-30 ENCOUNTER — Encounter (HOSPITAL_COMMUNITY): Payer: Self-pay | Admitting: General Practice

## 2014-08-30 ENCOUNTER — Inpatient Hospital Stay (HOSPITAL_COMMUNITY)
Admission: AD | Admit: 2014-08-30 | Discharge: 2014-09-01 | DRG: 775 | Disposition: A | Discharge status: Home / Self Care | Payer: Medicaid Other | Source: Ambulatory Visit | Attending: Obstetrics and Gynecology | Admitting: Obstetrics and Gynecology

## 2014-08-30 DIAGNOSIS — O323XX0 Maternal care for face, brow and chin presentation, not applicable or unspecified: Secondary | ICD-10-CM | POA: Clinically undetermined

## 2014-08-30 DIAGNOSIS — D563 Thalassemia minor: Secondary | ICD-10-CM | POA: Diagnosis present

## 2014-08-30 DIAGNOSIS — Z3A37 37 weeks gestation of pregnancy: Secondary | ICD-10-CM | POA: Diagnosis present

## 2014-08-30 DIAGNOSIS — O42013 Preterm premature rupture of membranes, onset of labor within 24 hours of rupture, third trimester: Secondary | ICD-10-CM | POA: Diagnosis present

## 2014-08-30 DIAGNOSIS — D561 Beta thalassemia: Secondary | ICD-10-CM

## 2014-08-30 LAB — CBC
HCT: 37.5 % (ref 36.0–46.0)
Hemoglobin: 12.5 g/dL (ref 12.0–15.0)
MCH: 25.7 pg — ABNORMAL LOW (ref 26.0–34.0)
MCHC: 33.3 g/dL (ref 30.0–36.0)
MCV: 77.2 fL — ABNORMAL LOW (ref 78.0–100.0)
PLATELETS: 143 10*3/uL — AB (ref 150–400)
RBC: 4.86 MIL/uL (ref 3.87–5.11)
RDW: 15.5 % (ref 11.5–15.5)
WBC: 9.4 10*3/uL (ref 4.0–10.5)

## 2014-08-30 LAB — TYPE AND SCREEN
ABO/RH(D): O POS
ANTIBODY SCREEN: NEGATIVE

## 2014-08-30 MED ORDER — CITRIC ACID-SODIUM CITRATE 334-500 MG/5ML PO SOLN: 30.0000 mL | ORAL | Status: DC | PRN

## 2014-08-30 MED ORDER — DIPHENHYDRAMINE HCL 25 MG PO CAPS: 25.0000 mg | ORAL_CAPSULE | Freq: Four times a day (QID) | ORAL | Status: DC | PRN

## 2014-08-30 MED ORDER — ONDANSETRON HCL 4 MG PO TABS: 4.0000 mg | ORAL_TABLET | ORAL | Status: DC | PRN

## 2014-08-30 MED ORDER — WITCH HAZEL-GLYCERIN EX PADS: 1.0000 "application " | MEDICATED_PAD | CUTANEOUS | Status: DC | PRN

## 2014-08-30 MED ORDER — LIDOCAINE HCL (PF) 1 % IJ SOLN
30.0000 mL | INTRAMUSCULAR | Status: DC | PRN
Start: 2014-08-30 — End: 2014-09-01
  Administered 2014-08-30: 30 mL via SUBCUTANEOUS
  Filled 2014-08-30: qty 30

## 2014-08-30 MED ORDER — ONDANSETRON HCL 4 MG/2ML IJ SOLN: 4.0000 mg | Freq: Four times a day (QID) | INTRAMUSCULAR | Status: DC | PRN

## 2014-08-30 MED ORDER — MAGNESIUM HYDROXIDE 400 MG/5ML PO SUSP: 30.0000 mL | ORAL | Status: DC | PRN

## 2014-08-30 MED ORDER — SENNOSIDES-DOCUSATE SODIUM 8.6-50 MG PO TABS
2.0000 | ORAL_TABLET | ORAL | Status: DC
  Administered 2014-08-30: 2 via ORAL
  Filled 2014-08-30: qty 2

## 2014-08-30 MED ORDER — OXYCODONE-ACETAMINOPHEN 5-325 MG PO TABS
1.0000 | ORAL_TABLET | ORAL | Status: DC | PRN
  Administered 2014-08-30 – 2014-09-01 (×3): 1 via ORAL
  Filled 2014-08-30 (×3): qty 1

## 2014-08-30 MED ORDER — METHYLERGONOVINE MALEATE 0.2 MG/ML IJ SOLN: 0.2000 mg | INTRAMUSCULAR | Status: DC | PRN

## 2014-08-30 MED ORDER — BUTORPHANOL TARTRATE 1 MG/ML IJ SOLN: 1.0000 mg | INTRAMUSCULAR | Status: DC | PRN

## 2014-08-30 MED ORDER — IBUPROFEN 600 MG PO TABS
600.0000 mg | ORAL_TABLET | Freq: Four times a day (QID) | ORAL | Status: DC
  Administered 2014-08-30 – 2014-09-01 (×7): 600 mg via ORAL
  Filled 2014-08-30 (×7): qty 1

## 2014-08-30 MED ORDER — ONDANSETRON HCL 4 MG/2ML IJ SOLN: 4.0000 mg | INTRAMUSCULAR | Status: DC | PRN

## 2014-08-30 MED ORDER — METHYLERGONOVINE MALEATE 0.2 MG PO TABS: 0.2000 mg | ORAL_TABLET | ORAL | Status: DC | PRN

## 2014-08-30 MED ORDER — PRENATAL MULTIVITAMIN CH
1.0000 | ORAL_TABLET | Freq: Every day | ORAL | Status: DC
  Administered 2014-08-31: 1 via ORAL
  Filled 2014-08-30: qty 1

## 2014-08-30 MED ORDER — ZOLPIDEM TARTRATE 5 MG PO TABS: 5.0000 mg | ORAL_TABLET | Freq: Every evening | ORAL | Status: DC | PRN

## 2014-08-30 MED ORDER — LACTATED RINGERS IV SOLN: 500.0000 mL | INTRAVENOUS | Status: DC | PRN

## 2014-08-30 MED ORDER — DIBUCAINE 1 % RE OINT: 1.0000 "application " | TOPICAL_OINTMENT | RECTAL | Status: DC | PRN

## 2014-08-30 MED ORDER — LACTATED RINGERS IV SOLN
INTRAVENOUS | Status: DC
  Administered 2014-08-30: 11:00:00 via INTRAVENOUS

## 2014-08-30 MED ORDER — OXYTOCIN BOLUS FROM INFUSION
500.0000 mL | INTRAVENOUS | Status: DC
  Administered 2014-08-30: 500 mL via INTRAVENOUS

## 2014-08-30 MED ORDER — MEASLES, MUMPS & RUBELLA VAC ~~LOC~~ INJ: 0.5000 mL | INJECTION | Freq: Once | SUBCUTANEOUS | Status: DC

## 2014-08-30 MED ORDER — OXYTOCIN 40 UNITS IN LACTATED RINGERS INFUSION - SIMPLE MED: 62.5000 mL/h | INTRAVENOUS | Status: DC

## 2014-08-30 MED ORDER — SIMETHICONE 80 MG PO CHEW: 80.0000 mg | CHEWABLE_TABLET | ORAL | Status: DC | PRN

## 2014-08-30 MED ORDER — OXYCODONE-ACETAMINOPHEN 5-325 MG PO TABS: 1.0000 | ORAL_TABLET | ORAL | Status: DC | PRN

## 2014-08-30 MED ORDER — TETANUS-DIPHTH-ACELL PERTUSSIS 5-2.5-18.5 LF-MCG/0.5 IM SUSP: 0.5000 mL | Freq: Once | INTRAMUSCULAR | Status: DC

## 2014-08-30 MED ORDER — BENZOCAINE-MENTHOL 20-0.5 % EX AERO: 1.0000 "application " | INHALATION_SPRAY | CUTANEOUS | Status: DC | PRN

## 2014-08-30 MED ORDER — OXYCODONE-ACETAMINOPHEN 5-325 MG PO TABS: 2.0000 | ORAL_TABLET | ORAL | Status: DC | PRN

## 2014-08-30 MED ORDER — LANOLIN HYDROUS EX OINT: TOPICAL_OINTMENT | CUTANEOUS | Status: DC | PRN

## 2014-08-30 MED ORDER — OXYTOCIN 40 UNITS IN LACTATED RINGERS INFUSION - SIMPLE MED
1.0000 m[IU]/min | INTRAVENOUS | Status: DC
  Administered 2014-08-30: 2 m[IU]/min via INTRAVENOUS
  Filled 2014-08-30: qty 1000

## 2014-08-30 MED ORDER — TERBUTALINE SULFATE 1 MG/ML IJ SOLN
0.2500 mg | Freq: Once | INTRAMUSCULAR | Status: DC | PRN
  Filled 2014-08-30: qty 1

## 2014-08-30 NOTE — H&P (Signed)
Dorothy Young QualeMonshea Hult is a 24 y.o. female, G4 37P2012, [facility] 37[facility].  Eval in MAU confirmed ROM with + fern and leak, no regular ctx.  Prenatal care essentially uncomplicated, Beta-thal minor, see prenatal records for complete history.  Maternal Medical History:  Reason for admission: Rupture of membranes.   Contractions: Frequency: irregular.   Perceived severity is mild.    Fetal activity: Perceived fetal activity is normal.    Prenatal complications: no prenatal complications   OB History    Gravida Para Term Preterm AB TAB SAB Ectopic Multiple Living   4 2 2  1  1   2     SVD at term x 2  Past Medical History  Diagnosis Date  . Worsening headaches   . Pregnancy   . Anemia     thalassemia Beta minor  . Thalassemia, beta 05/18/2014   Past Surgical History  Procedure Laterality Date  . No past surgeries     Family History: family history includes Cancer in her paternal aunt; Diabetes in her paternal grandmother. Social History:  reports that she has never smoked. She has never used smokeless tobacco. She reports that she does not drink alcohol or use illicit drugs.   Prenatal Transfer Tool  Maternal Diabetes: No Genetic Screening: Normal Maternal Ultrasounds/Referrals: Normal Fetal Ultrasounds or other Referrals:  None Maternal Substance Abuse:  No Significant Maternal Medications:  None Significant Maternal Lab Results:  Lab values include: Group B Strep negative Other Comments:  Beta thalassemia minor  Review of Systems  Respiratory: Negative.   Cardiovascular: Negative.     Dilation: 3 Effacement (%): 90 Station: -1 Exam by:: L Lamon RN Blood pressure 112/62, pulse 97, temperature 98.7 F (37.1 C), temperature source Oral, resp. rate 18, height 5\' 10"  (1.778 m), weight 81.194 kg (179 lb). Maternal Exam:  Uterine Assessment: Contraction strength is mild.  Contraction frequency is irregular.   Abdomen: Patient reports  no abdominal tenderness. Estimated fetal weight is 6 1/2 lbs.   Fetal presentation: vertex  Introitus: Normal vulva. Normal vagina.  Ferning test: positive.  Amniotic fluid character: clear.  Pelvis: adequate for delivery.   Cervix: Cervix evaluated by digital exam.     Fetal Exam Fetal Monitor Review: Mode: ultrasound.   Baseline rate: 130.  Variability: moderate (6-25 bpm).   Pattern: accelerations present and no decelerations.    Fetal State Assessment: Category I - tracings are normal.     Physical Exam  Vitals reviewed. Constitutional: She appears well-developed and well-nourished.  Cardiovascular: Normal rate, regular rhythm and normal heart sounds.   No murmur heard. Respiratory: Effort normal and breath sounds normal. No respiratory distress. She has no wheezes.  GI: Soft.    Prenatal labs: ABO, Rh: O/Positive/-- (07/14 0000) Antibody: Negative (07/14 0000) Rubella: Immune (07/14 0000) RPR: Nonreactive (10/29 0000)  HBsAg: Negative (07/14 0000)  HIV: Non-reactive (10/29 0000)  GBS: Negative (12/29 0000)  GCT:  87  Assessment/Plan: IUP at 37+ weeks with PROM.  Will augment with pitocin and monitor progress.   Tanairy Payeur D 08/30/2014, 1:19 PM

## 2014-08-30 NOTE — MAU Note (Addendum)
contracting all night, tried to sleep, but they would wake her ~about every 10 min. No bleeding.   Started leaking watery, clear fluid around 0400. Was 3 when last checked.

## 2014-08-31 LAB — RPR: RPR Ser Ql: NONREACTIVE

## 2014-08-31 NOTE — Progress Notes (Signed)
PPD #1 No problems Afeb, VSS Fundus firm, NT at U-1 Continue routine postpartum care 

## 2014-08-31 NOTE — Progress Notes (Signed)
UR chart review completed.  

## 2014-09-01 MED ORDER — OXYCODONE-ACETAMINOPHEN 5-325 MG PO TABS: 1.0000 | ORAL_TABLET | Freq: Four times a day (QID) | ORAL | Status: DC | PRN

## 2014-09-01 MED ORDER — IBUPROFEN 600 MG PO TABS: 600.0000 mg | ORAL_TABLET | Freq: Four times a day (QID) | ORAL | Status: DC | PRN
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
## 2014-09-01 NOTE — Progress Notes (Signed)
Patient ID: Dorothy Young, female   DOB: 17-Jul-1991, 24 y.o.   MRN: [id number] #2 afebrile BP normal for d/c

## 2014-09-01 NOTE — Discharge Instructions (Signed)
booklet °

## 2014-09-11 ENCOUNTER — Inpatient Hospital Stay (HOSPITAL_COMMUNITY): Admission: RE | Admit: 2014-09-11 | Payer: Medicaid Other | Source: Ambulatory Visit

## 2015-12-06 ENCOUNTER — Emergency Department (HOSPITAL_COMMUNITY): Payer: Self-pay

## 2015-12-06 ENCOUNTER — Encounter (HOSPITAL_COMMUNITY): Payer: Self-pay | Admitting: Emergency Medicine

## 2015-12-06 ENCOUNTER — Emergency Department (HOSPITAL_COMMUNITY)
Admission: EM | Admit: 2015-12-06 | Discharge: 2015-12-06 | Disposition: A | Discharge status: Home / Self Care | Payer: Self-pay | Attending: Emergency Medicine | Admitting: Emergency Medicine

## 2015-12-06 DIAGNOSIS — Y9389 Activity, other specified: Secondary | ICD-10-CM | POA: Insufficient documentation

## 2015-12-06 DIAGNOSIS — Z79899 Other long term (current) drug therapy: Secondary | ICD-10-CM | POA: Insufficient documentation

## 2015-12-06 DIAGNOSIS — Z862 Personal history of diseases of the blood and blood-forming organs and certain disorders involving the immune mechanism: Secondary | ICD-10-CM | POA: Insufficient documentation

## 2015-12-06 DIAGNOSIS — Y998 Other external cause status: Secondary | ICD-10-CM | POA: Insufficient documentation

## 2015-12-06 DIAGNOSIS — Y92410 Unspecified street and highway as the place of occurrence of the external cause: Secondary | ICD-10-CM | POA: Insufficient documentation

## 2015-12-06 DIAGNOSIS — Z041 Encounter for examination and observation following transport accident: Secondary | ICD-10-CM | POA: Insufficient documentation

## 2015-12-06 LAB — POC URINE PREG, ED: Preg Test, Ur: NEGATIVE

## 2015-12-06 MED ORDER — DIAZEPAM 5 MG PO TABS: 5.0000 mg | ORAL_TABLET | Freq: Two times a day (BID) | ORAL | Status: DC

## 2015-12-06 MED ORDER — NAPROXEN 250 MG PO TABS
500.0000 mg | ORAL_TABLET | Freq: Once | ORAL | Status: AC
  Administered 2015-12-06: 500 mg via ORAL
  Filled 2015-12-06: qty 2

## 2015-12-06 MED ORDER — NAPROXEN 500 MG PO TABS: 500.0000 mg | ORAL_TABLET | Freq: Two times a day (BID) | ORAL | Status: DC

## 2015-12-06 MED ORDER — DIAZEPAM 5 MG PO TABS
5.0000 mg | ORAL_TABLET | Freq: Once | ORAL | Status: DC
  Filled 2015-12-06: qty 1
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
## 2015-12-06 NOTE — Progress Notes (Signed)
Orthopedic Tech Progress Note Patient Details:  Dorothy Young Mar 06, 1991 [id number]  Ortho Devices Type of Ortho Device: Soft collar Ortho Device/Splint Location: neck Ortho Device/Splint Interventions: Ordered, Application   Jennye MoccasinHughes, Yariana Hoaglund Craig 12/06/2015, 10:28 PM
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
## 2015-12-29 ENCOUNTER — Emergency Department (HOSPITAL_COMMUNITY)
Admission: EM | Admit: 2015-12-29 | Discharge: 2015-12-29 | Disposition: A | Discharge status: Home / Self Care | Payer: No Typology Code available for payment source | Attending: Emergency Medicine | Admitting: Emergency Medicine

## 2015-12-29 ENCOUNTER — Encounter (HOSPITAL_COMMUNITY): Payer: Self-pay | Admitting: Emergency Medicine

## 2015-12-29 DIAGNOSIS — K0889 Other specified disorders of teeth and supporting structures: Secondary | ICD-10-CM

## 2015-12-29 DIAGNOSIS — Z79899 Other long term (current) drug therapy: Secondary | ICD-10-CM | POA: Insufficient documentation

## 2015-12-29 DIAGNOSIS — F419 Anxiety disorder, unspecified: Secondary | ICD-10-CM | POA: Insufficient documentation

## 2015-12-29 DIAGNOSIS — Z862 Personal history of diseases of the blood and blood-forming organs and certain disorders involving the immune mechanism: Secondary | ICD-10-CM | POA: Insufficient documentation

## 2015-12-29 DIAGNOSIS — K047 Periapical abscess without sinus: Secondary | ICD-10-CM | POA: Insufficient documentation

## 2015-12-29 DIAGNOSIS — H9201 Otalgia, right ear: Secondary | ICD-10-CM | POA: Insufficient documentation

## 2015-12-29 MED ORDER — PENICILLIN V POTASSIUM 250 MG PO TABS
500.0000 mg | ORAL_TABLET | Freq: Once | ORAL | Status: AC
  Administered 2015-12-29: 500 mg via ORAL
  Filled 2015-12-29: qty 2

## 2015-12-29 MED ORDER — ACETAMINOPHEN 325 MG PO TABS
650.0000 mg | ORAL_TABLET | Freq: Once | ORAL | Status: AC
  Administered 2015-12-29: 650 mg via ORAL
  Filled 2015-12-29: qty 2

## 2015-12-29 MED ORDER — PENICILLIN V POTASSIUM 500 MG PO TABS: 500.0000 mg | ORAL_TABLET | Freq: Four times a day (QID) | ORAL | Status: DC

## 2015-12-29 MED ORDER — NAPROXEN 250 MG PO TABS: 250.0000 mg | ORAL_TABLET | Freq: Two times a day (BID) | ORAL | Status: DC

## 2015-12-29 NOTE — Discharge Instructions (Signed)
Dental Abscess °A dental abscess is a collection of pus in or around a tooth. °CAUSES °This condition is caused by a bacterial infection around the root of the tooth that involves the inner part of the tooth (pulp). It may result from: °· Severe tooth decay. °· Trauma to the tooth that allows bacteria to enter into the pulp, such as a broken or chipped tooth. °· Severe gum disease around a tooth. °SYMPTOMS °Symptoms of this condition include: °· Severe pain in and around the infected tooth. °· Swelling and redness around the infected tooth, in the mouth, or in the face. °· Tenderness. °· Pus drainage. °· Bad breath. °· Bitter taste in the mouth. °· Difficulty swallowing. °· Difficulty opening the mouth. °· Nausea. °· Vomiting. °· Chills. °· Swollen neck glands. °· Fever. °DIAGNOSIS °This condition is diagnosed with examination of the infected tooth. During the exam, your dentist may tap on the infected tooth. Your dentist will also ask about your medical and dental history and may order X-rays. °TREATMENT °This condition is treated by eliminating the infection. This may be done with: °· Antibiotic medicine. °· A root canal. This may be performed to save the tooth. °· Pulling (extracting) the tooth. This may also involve draining the abscess. This is done if the tooth cannot be saved. °HOME CARE INSTRUCTIONS °· Take medicines only as directed by your dentist. °· If you were prescribed antibiotic medicine, finish all of it even if you start to feel better. °· Rinse your mouth (gargle) often with salt water to relieve pain or swelling. °· Do not drive or operate heavy machinery while taking pain medicine. °· Do not apply heat to the outside of your mouth. °· Keep all follow-up visits as directed by your dentist. This is important. °SEEK MEDICAL CARE IF: °· Your pain is worse and is not helped by medicine. °SEEK IMMEDIATE MEDICAL CARE IF: °· You have a fever or chills. °· Your symptoms suddenly get worse. °· You have a  very bad headache. °· You have problems breathing or swallowing. °· You have trouble opening your mouth. °· You have swelling in your neck or around your eye. °  °This information is not intended to replace advice given to you by your health care provider. Make sure you discuss any questions you have with your health care provider. °  °Document Released: 08/04/2005 Document Revised: 12/19/2014 Document Reviewed: 08/01/2014 °Elsevier Interactive Patient Education ©2016 Elsevier Inc. °Community Resource Guide Dental °The United Way’s “211” is a great source of information about community services available.  Access by dialing 2-1-1 from anywhere in , or by website -  www.nc211.org.  ° °Other Local Resources (Updated 08/2015) ° °Dental  Care °  °Services ° °  °Phone Number and Address  °Cost  °[facility] For children 0 - 21 years of age:  °• Cleaning °• Tooth brushing/flossing instruction °• Sealants, fillings, crowns °• Extractions °• Emergency treatment  [phone] °[address] °[place], [place] [address] Charges based on family income.  Medicaid and some insurance plans accepted.   °  °[facility] [place] • Cleaning °• Sealants, fillings, crowns °• Extractions °• Emergency treatment [phone] °[address] °[place], [place] ° Pregnant women 18 years of age or older with a Medicaid card  °[facility] [place] • Cleaning °• Sealants, fillings, crowns °• Extractions °• Emergency treatment [phone] °[address] °[place], [place] Pregnant   women 18 years of age or older with a Medicaid card  °[facility] For children 0 - 21 years of age:  °• Cleaning °• Tooth brushing/flossing instruction °• Sealants, fillings, crowns °• Extractions °• Emergency treatment °Limited orthodontic services for patients with Medicaid [phone] °[address] °[place], [place] [address] Medicaid and [place] Health Choice cover for children up to age 21 and pregnant women.  Parents of children up to age 21 without Medicaid pay a reduced fee at time of service.  °[facility] [place] For children 0 - 21 years of age:  °• Cleaning °• Tooth brushing/flossing instruction °• Sealants, fillings, crowns °• Extractions °• Emergency treatment °Limited orthodontic services for patients with Medicaid [phone] °[address] °[place], [place].  Medicaid and [place] Health Choice cover for children up to age 21 and pregnant women.  Parents of children up to age 21 without Medicaid pay a reduced fee.  °[facility] • Cleaning °• Sealants, fillings, crowns °• Extractions ° °Hours: Tuesdays and Thursdays, 4:15 - 8 pm [phone] °[address] °[place], [place] [address] Services free of charge to [place] residents ages 18-64 who do not have health insurance, Medicare, Medicaid, or VA benefits and fall within federal poverty guidelines  °[facility] ° ° ° Provides dental care in addition to primary medical care, nutritional counseling, and pharmacy: °• Cleaning °• Sealants, fillings, crowns °• Extractions ° ° ° ° ° ° ° ° ° ° ° ° ° ° ° ° ° [phone] °[facility], [address] °[place], [place] ° °[phone] °[facility], [address] [place], [place] ° °[phone] °[facility] °[place], [place] ° °[phone] °[facility], [address] °[place], [place] ° °[phone] °[facility] °[address] °[place], [place] Accepts Medicaid, Medicare, most insurance.  Also provides services available to all with fees adjusted based on ability to pay.    °[facility] • Cleaning °• Tooth brushing/flossing  instruction °• Sealants, fillings, crowns °• Extractions °• Emergency treatment °Hours: Tuesdays, Thursdays, and Fridays from 8 am to 5 pm by appointment only. [phone] °[address] °[place], [place] [address] [place] residents with Medicaid (depending on eligibility) and children with [place] Health Choice - call for more information.  °[facility] • Extractions only ° °Hours: 2nd and 4th Thursday of each month from 6:30 am - 9 am.   [phone] ext. [phone] °[address] °[place], [place] [address] Ages 18 and older only.  Patients are seen on a first come, first served basis.  °[facility] • Cleanings °• Fillings °• Extractions °• Orthodontics °• Endodontics °• Implants/Crowns/Bridges °• Complete and partial dentures [phone] °[place], [place] Patients must complete an application for services.  There is often a waiting list.   ° °

## 2015-12-29 NOTE — ED Notes (Signed)
Started last pm with right lower dental pain. Woke today with facial swelling,

## 2015-12-29 NOTE — ED Provider Notes (Signed)
CSN: [id number]     Arrival date & time 12/29/15  1343 History  By signing my name below, I, Dorothy Young, attest that this documentation has been prepared under the direction and in the presence of Dorothy Farrier, PA-C. Electronically Signed: Tanda Young, ED Scribe. 12/29/2015. 1:58 PM.   Chief Complaint  Patient presents with  . Dental Pain   The history is provided by the patient. No language interpreter was used.    HPI Comments: Dorothy Young is a 25 y.o. female who presents to the Emergency Department complaining of gradual onset, constant, 8/10, right lower dental pain that began 3 days ago. Pt also complains of right sided facial swelling that began yesterday, right ear pain, and a headache. She has been taking Aspirin without relief and took ibuprofen approximately 2 hours ago. Pt is able to open and close her jaw. She has been staying hydrated but has not been eating as much due to the pain. Pt does not have a dentist at the moment nor does she have dental insurance. Denies fever, chills, drainage from teeth, neck stiffness, chest pain, shortness of breath, abdominal pain, nausea, vomiting, diarrhea, or any other associated symptoms.   Past Medical History  Diagnosis Date  . Worsening headaches   . Pregnancy   . Anemia     thalassemia Beta minor  . Thalassemia, beta (HCC) 05/18/2014   Past Surgical History  Procedure Laterality Date  . No past surgeries     Family History  Problem Relation Age of Onset  . Diabetes Paternal Grandmother   . Cancer Paternal Aunt     unknown cancer   Social History  Substance Use Topics  . Smoking status: Never Smoker   . Smokeless tobacco: Never Used  . Alcohol Use: No   OB History    Gravida Para Term Preterm AB TAB SAB Ectopic Multiple Living   0 1     Review of Systems  Constitutional: Negative for fever and chills.  HENT: Positive for dental problem, ear pain (right) and facial swelling. Negative for  drooling, sore throat, trouble swallowing and voice change.   Eyes: Negative for visual disturbance.  Respiratory: Negative for shortness of breath.   Cardiovascular: Negative for chest pain.  Gastrointestinal: Negative for nausea, vomiting, abdominal pain and diarrhea.  Musculoskeletal: Negative for neck pain and neck stiffness.  Skin: Negative for rash.  Neurological: Positive for headaches. Negative for dizziness and light-headedness.   Allergies  Review of patient's allergies indicates no known allergies.  Home Medications   Prior to Admission medications   Medication Sig Start Date End Date Taking? Authorizing Provider  diazepam (VALIUM) 5 MG tablet Take 1 tablet (5 mg total) by mouth 2 (two) times daily. 12/06/15   Melton Krebs, PA-C  ibuprofen (ADVIL,MOTRIN) 600 MG tablet Take 1 tablet (600 mg total) by mouth every 6 (six) hours as needed. 09/01/14   Tracey Harries, MD  naproxen (NAPROSYN) 250 MG tablet Take 1 tablet (250 mg total) by mouth 2 (two) times daily with a meal. 12/29/15   Dorothy Farrier, PA-C  oxyCODONE-acetaminophen (PERCOCET/ROXICET) 5-325 MG per tablet Take 1 tablet by mouth every 6 (six) hours as needed (for pain scale less than 7). 09/01/14   Tracey Harries, MD  penicillin v potassium (VEETID) 500 MG tablet Take 1 tablet (500 mg total) by mouth 4 (four) times daily. 12/29/15   Dorothy Farrier, PA-C   BP 130/67 mmHg  Pulse  118  Temp(Src) 99.1 F (37.3 C) (Oral)  Resp 18  SpO2 99%  LMP 11/15/2015 (Approximate)   Physical Exam  Constitutional: She appears well-developed and well-nourished. No distress.  Non-toxic appearing.   HENT:  Head: Normocephalic and atraumatic.  Right Ear: External ear normal.  Left Ear: External ear normal.  Mouth/Throat: Oropharynx is clear and moist. No oropharyngeal exudate.  No discharge from the mouth. Swelling to right lower jaw.  Uvula is midline without edema. Soft palate rises symmetrically. No tonsillar hypertrophy or  exudates. Tongue protrusion is normal. No pain beneath tongue.  Swelling, tenderness, and induration to right lower gumline. No fluctuant abscess to drain. No trismus.  Bilateral tympanic membranes are pearly-gray without erythema or loss of landmarks.   Eyes: Conjunctivae are normal. Pupils are equal, round, and reactive to light. Right eye exhibits no discharge. Left eye exhibits no discharge.  Neck: Normal range of motion. Neck supple. No JVD present. No tracheal deviation present.  Cardiovascular: Normal rate, normal heart sounds and intact distal pulses.   HR is 112.   Pulmonary/Chest: Effort normal and breath sounds normal. No stridor. No respiratory distress.  Abdominal: Soft. There is no tenderness.  Lymphadenopathy:    She has no cervical adenopathy.  Neurological: She is alert. No cranial nerve deficit. Coordination normal.  Skin: Skin is warm and dry. No rash noted. She is not diaphoretic. No erythema. No pallor.  Psychiatric: Her behavior is normal. Her mood appears anxious.  Patient appears anxious.   Nursing note and vitals reviewed.   ED Course  Procedures (including critical care time)  DIAGNOSTIC STUDIES: Oxygen Saturation is 99% on RA, normal by my interpretation.    COORDINATION OF CARE: 1:56 PM-Discussed treatment plan which includes Rx antibiotics with pt at bedside and pt agreed to plan.    MDM   Meds given in ED:  Medications  acetaminophen (TYLENOL) tablet 650 mg (not administered)  penicillin v potassium (VEETID) tablet 500 mg (not administered)    New Prescriptions   NAPROXEN (NAPROSYN) 250 MG TABLET    Take 1 tablet (250 mg total) by mouth 2 (two) times daily with a meal.   PENICILLIN V POTASSIUM (VEETID) 500 MG TABLET    Take 1 tablet (500 mg total) by mouth 4 (four) times daily.    Final diagnoses:  Pain, dental  Dental abscess   This is a 25 y.o. female who presents to the Emergency Department complaining of gradual onset, constant, 8/10,  right lower dental pain that began 3 days ago. Pt also complains of right sided facial swelling that began yesterday, right ear pain, and a headache. She has been taking Aspirin without relief and took ibuprofen approximately 2 hours ago. Pt is able to open and close her jaw. She has been staying hydrated but has not been eating as much due to the pain. Patient with dentalgia to right lower jaw with right lateral edema to mandible.   No fluctuant abscess that I would be able to incision and drainage.   Exam not concerning for Ludwig's angina or pharyngeal abscess.  Will treat with Penicillin and Naprosyn. Pt instructed to follow-up with dentist and provided with dental resource guide.  Discussed return precautions. Pt safe for discharge. I discussed strict and specific return precautions. I advised the patient to follow-up with their primary care provider this week. I advised the patient to return to the emergency department with new or worsening symptoms or new concerns. The patient verbalized understanding and agreement with  plan.    I personally performed the services described in this documentation, which was scribed in my presence. The recorded information has been reviewed and is accurate.          Dorothy FarrierWilliam Junko Ohagan, PA-C 12/29/15 1407  Loren Raceravid Yelverton, MD 12/31/15 [phone]

## 2016-06-03 ENCOUNTER — Encounter: Payer: Self-pay | Admitting: Obstetrics and Gynecology

## 2016-06-05 ENCOUNTER — Ambulatory Visit (INDEPENDENT_AMBULATORY_CARE_PROVIDER_SITE_OTHER): Payer: Medicaid Other | Admitting: Obstetrics and Gynecology

## 2016-06-05 ENCOUNTER — Other Ambulatory Visit: Payer: Self-pay | Admitting: Obstetrics and Gynecology

## 2016-06-05 ENCOUNTER — Encounter: Payer: Self-pay | Admitting: Obstetrics and Gynecology

## 2016-06-05 ENCOUNTER — Other Ambulatory Visit (INDEPENDENT_AMBULATORY_CARE_PROVIDER_SITE_OTHER): Payer: Medicaid Other

## 2016-06-05 VITALS — BP 101/65 | HR 88 | Wt 154.3 lb

## 2016-06-05 DIAGNOSIS — Z3682 Encounter for antenatal screening for nuchal translucency: Secondary | ICD-10-CM

## 2016-06-05 DIAGNOSIS — O3680X0 Pregnancy with inconclusive fetal viability, not applicable or unspecified: Secondary | ICD-10-CM | POA: Diagnosis not present

## 2016-06-05 DIAGNOSIS — Z113 Encounter for screening for infections with a predominantly sexual mode of transmission: Secondary | ICD-10-CM

## 2016-06-05 DIAGNOSIS — N920 Excessive and frequent menstruation with regular cycle: Secondary | ICD-10-CM

## 2016-06-05 DIAGNOSIS — N939 Abnormal uterine and vaginal bleeding, unspecified: Secondary | ICD-10-CM

## 2016-06-05 DIAGNOSIS — O2691 Pregnancy related conditions, unspecified, first trimester: Secondary | ICD-10-CM

## 2016-06-05 DIAGNOSIS — Z3689 Encounter for other specified antenatal screening: Secondary | ICD-10-CM

## 2016-06-05 DIAGNOSIS — Z369 Encounter for antenatal screening, unspecified: Secondary | ICD-10-CM

## 2016-06-05 DIAGNOSIS — Z8619 Personal history of other infectious and parasitic diseases: Secondary | ICD-10-CM | POA: Insufficient documentation

## 2016-06-05 DIAGNOSIS — Z87898 Personal history of other specified conditions: Secondary | ICD-10-CM

## 2016-06-05 DIAGNOSIS — E049 Nontoxic goiter, unspecified: Secondary | ICD-10-CM

## 2016-06-05 DIAGNOSIS — Z8742 Personal history of other diseases of the female genital tract: Secondary | ICD-10-CM

## 2016-06-05 NOTE — Patient Instructions (Signed)

## 2016-06-05 NOTE — Progress Notes (Signed)
OBSTETRIC INITIAL PRENATAL VISIT  Subjective:    Dorothy Young is being seen today for her first obstetrical visit.  This is not a planned pregnancy. Patient had pregnancy confirmation at [facility]. She is a 25 y.o. [id number] female [redacted]w[redacted]d gestation, Estimated Date of Delivery: 12/16/2016 by last menstrual period of 03/11/2016 (although patient is unsure of her dates).  Her obstetrical history is significant for none. Relationship with FOB: significant other, living together. Patient does not intend to breast feed. Pregnancy history fully reviewed.    Obstetric History   G5   P3   T3   P0   A1   L3    SAB0   TAB0   Ectopic0   Multiple0   Live Births3     # Outcome Date GA Lbr Len/2nd Weight Sex Delivery Anes PTL Lv  5 Current           4 Term 08/30/14 [redacted]w[redacted]d 01:44 / 00:23 6 lb 13 oz (3.09 kg) M Vag-Spont None  LIV     Apgar1:  8                Apgar5: 9  3 AB 2012 [redacted]w[redacted]d    TAB     2 Term 07/22/10 [redacted]w[redacted]d  6 lb 8 oz (2.948 kg) F Vag-Spont None N LIV  1 Term 09/15/08 [redacted]w[redacted]d  6 lb 7 oz (2.92 kg) F Vag-Spont None N LIV      Gynecologic History:  Last pap smear was 2015.  Results were abnormal (LGSIL).  Denies prior h/o abnormal pap smears in the past.  Admits to history of STIs: Chlamydia in 2010, false positive RPR in 2009 pregnancy, (treated x 1 dose for possible syphilis).    Past Medical History:  Diagnosis Date  . Thalassemia, beta (HCC) 05/18/2014  . Worsening headaches     Family History  Problem Relation Age of Onset  . Diabetes Paternal Grandmother   . Cancer Paternal Aunt     unknown cancer    Past Surgical History:  Procedure Laterality Date  . NO PAST SURGERIES      Social History   Social History  . Marital status: Single    Spouse name: N/A  . Number of children: 2  . Years of education: 11   Occupational History  .  Unemployed   Social History Main Topics  . Smoking status: Never Smoker  . Smokeless tobacco: Never Used  . Alcohol use No  . Drug use:  No  . Sexual activity: Yes   Other Topics Concern  . Not on file   Social History Narrative   Patient is single and lives with her mother.   Patient has two children.   Patient is not currently not working.   Patient has a high school education.   Patient is right-handed.   Patient drinks very little soda- not everyday.    No current outpatient prescriptions on file prior to visit.   No current facility-administered medications on file prior to visit.     No Known Allergies   Review of Systems General:Not Present- Fever, Weight Loss and Weight Gain. Skin:Not Present- Rash. HEENT:Not Present- Blurred Vision, Headache and Bleeding Gums. Respiratory:Not Present- Difficulty Breathing. Breast:Not Present- Breast Mass. Cardiovascular:Not Present- Chest Pain, Elevated Blood Pressure, Fainting / Blacking Out and Shortness of Breath. Gastrointestinal:Not Present- Abdominal Pain, Constipation, Nausea and Vomiting. Female Genitourinary:Not Present- Frequency, Painful Urination, Pelvic Pain, Vaginal Bleeding, Vaginal Discharge, Contractions, regular, Fetal Movements Decreased, Urinary Complaints and  Vaginal Fluid. Musculoskeletal:Not Present- Back Pain and Leg Cramps. Neurological:Not Present- Dizziness. Psychiatric:Not Present- Depression.     Objective:   Blood pressure 101/65, pulse 88, weight 154 lb 4.8 oz (70 kg), last menstrual period 03/11/2016, unknown if currently breastfeeding.  Body mass index is 22.14 kg/m.   General Appearance:    Alert, cooperative, no distress, appears stated age  Head:    Normocephalic, without obvious abnormality, atraumatic  Eyes:    PERRL, conjunctiva/corneas clear, EOM's intact, both eyes  Ears:    Normal external ear canals, both ears  Nose:   Nares normal, septum midline, mucosa normal, no drainage or sinus tenderness  Throat:   Lips, mucosa, and tongue normal; teeth and gums normal  Neck:   Supple, symmetrical, trachea midline,  no adenopathy; thyroid: goiter present.  No tenderness/nodules; no carotid bruit or JVD  Back:     Symmetric, no curvature, ROM normal, no CVA tenderness  Lungs:     Clear to auscultation bilaterally, respirations unlabored  Chest Wall:    No tenderness or deformity   Heart:    Regular rate and rhythm, S1 and S2 normal, no murmur, rub or gallop  Breast Exam:    No tenderness, masses, or nipple abnormality  Abdomen:     Soft, non-tender, bowel sounds active all four quadrants, no masses, no organomegaly.  FHT: unable to obtain on Dopplers.  Genitalia:    Pelvic:external genitalia normal, vagina without lesions, or tenderness, rectovaginal septum normal. Dark brown blood in vaginal vault. Cervix normal in appearance, no cervical motion tenderness, no adnexal masses or tenderness.  Pregnancy positive findings: uterine enlargement: 8-10 wk size, nontender.   Rectal:    Normal external sphincter.  No hemorrhoids appreciated. Internal exam not done.   Extremities:   Extremities normal, atraumatic, no cyanosis or edema  Pulses:   2+ and symmetric all extremities  Skin:   Skin color, texture, turgor normal, no rashes or lesions  Lymph nodes:   Cervical, supraclavicular, and axillary nodes normal  Neurologic:   CNII-XII intact, normal strength, sensation and reflexes throughout       Assessment:    Pregnancy at 12 and 2/7 weeks   Size < dates, with unsure LMP Vaginal spotting  Previous h/o STI H/o LGSIL pap Goiter   Plan:   Initial labs ordered. Pap smear performed today.  Prenatal vitamins encouraged. Problem list reviewed and updated. New OB counseling:  The patient has been given an overview regarding routine prenatal care.  Recommendations regarding diet, weight gain, and exercise in pregnancy were given. Prenatal testing, optional genetic testing, and ultrasound use in pregnancy were reviewed.  AFP3 discussed: requested. Benefits of Breast Feeding were discussed. The patient is  encouraged to consider nursing her baby post partum. Goiter present, will check thyroid studies. Follow up in 4 weeks.  50% of 30 min visit spent on counseling and coordination of care.       Addendum 06/05/16 at 10:32 a.m.:  ULTRASOUND REPORT  Location: [facility] Date of Service: 06/05/16  Indications:Dating and Viability Findings:  Intrauterine Gestational sac is visualized with a  consistent with 7 1/[redacted] weeks gestation, giving an (U/S) EDD of 01/21/17. The (U/S) EDD is NOT consistent with the clinically established (LMP) EDD of 12/16/16.  FHR: not seen Fetal pole not seen. .  Right Ovary measures 3.2 x 2.1 x 2.4 cm. It is normal in appearance. Left Ovary measures 3.9 x 2.4 x 2.2 cm. It is normal appearance. There is evidence  of a corpus luteal cyst in the Left Survey of the adnexa demonstrates no adnexal masses. There is no free peritoneal fluid in the cul de sac.  Impression: 1. 7 1/7 week  Intrauterine Gestational sac by U/S. 2. (U/S) EDD is NOT consistent with Clinically established (LMP) EDD of 12/16/16. 3. No Fetal pole seen.  Recommendations: 1.Clinical correlation with the patient's History and Physical Exam. 2. Return in 1 week for follow up US   Patient to have serial BHCGs, and repeat ultrasound in 1 week.     Hildred Laser, MD [facility]

## 2016-06-06 ENCOUNTER — Other Ambulatory Visit: Payer: Self-pay | Admitting: Hematology and Oncology

## 2016-06-06 LAB — URINE CULTURE

## 2016-06-06 LAB — MICROSCOPIC EXAMINATION
Bacteria, UA: NONE SEEN
Casts: NONE SEEN /LPF
Epithelial Cells (non renal): >10 /HPF — AB

## 2016-06-06 LAB — MONITOR DRUG PROFILE 14(MW)
Amphetamine Scrn, Ur: NEGATIVE ng/mL
BARBITURATE SCREEN URINE: NEGATIVE ng/mL
BENZODIAZEPINE SCREEN, URINE: NEGATIVE ng/mL
BUPRENORPHINE, URINE: NEGATIVE ng/mL
CANNABINOIDS UR QL SCN: NEGATIVE ng/mL
CREATININE(CRT), U: 117.1 mg/dL (ref 20.0–300.0)
Cocaine (Metab) Scrn, Ur: NEGATIVE ng/mL
FENTANYL, URINE: NEGATIVE pg/mL
METHADONE SCREEN, URINE: NEGATIVE ng/mL
Meperidine Screen, Urine: NEGATIVE ng/mL
OPIATE SCREEN URINE: NEGATIVE ng/mL
OXYCODONE+OXYMORPHONE UR QL SCN: NEGATIVE ng/mL
Ph of Urine: 5.9 (ref 4.5–8.9)
Phencyclidine Qn, Ur: NEGATIVE ng/mL
Propoxyphene Scrn, Ur: NEGATIVE ng/mL
SPECIFIC GRAVITY: 1.018
TRAMADOL SCREEN, URINE: NEGATIVE ng/mL

## 2016-06-06 LAB — URINALYSIS, ROUTINE W REFLEX MICROSCOPIC
BILIRUBIN UA: NEGATIVE
Glucose, UA: NEGATIVE
Ketones, UA: NEGATIVE
LEUKOCYTES UA: NEGATIVE
Nitrite, UA: NEGATIVE
PH UA: 6.5 (ref 5.0–7.5)
PROTEIN UA: NEGATIVE
Specific Gravity, UA: 1.018 (ref 1.005–1.030)
Urobilinogen, Ur: 0.2 mg/dL (ref 0.2–1.0)

## 2016-06-06 LAB — ABO AND RH: RH TYPE: POSITIVE

## 2016-06-06 LAB — BETA HCG QUANT (REF LAB): hCG Quant: 7125 m[IU]/mL

## 2016-06-07 LAB — CBC WITH DIFFERENTIAL/PLATELET
BASOS ABS: 0 10*3/uL (ref 0.0–0.2)
BASOS: 1 %
EOS (ABSOLUTE): 0.2 10*3/uL (ref 0.0–0.4)
Eos: 4 %
Hematocrit: 36.4 % (ref 34.0–46.6)
Hemoglobin: 12.1 g/dL (ref 11.1–15.9)
IMMATURE GRANULOCYTES: 1 %
Immature Grans (Abs): 0 10*3/uL (ref 0.0–0.1)
Lymphocytes Absolute: 1.8 10*3/uL (ref 0.7–3.1)
Lymphs: 44 %
MCH: 25.9 pg — ABNORMAL LOW (ref 26.6–33.0)
MCHC: 33.2 g/dL (ref 31.5–35.7)
MCV: 78 fL — AB (ref 79–97)
Monocytes Absolute: 0.5 10*3/uL (ref 0.1–0.9)
Monocytes: 12 %
NEUTROS PCT: 38 %
Neutrophils Absolute: 1.6 10*3/uL (ref 1.4–7.0)
PLATELETS: 172 10*3/uL (ref 150–379)
RBC: 4.67 x10E6/uL (ref 3.77–5.28)
RDW: 16.8 % — AB (ref 12.3–15.4)
WBC: 4.2 10*3/uL (ref 3.4–10.8)

## 2016-06-07 LAB — SICKLE CELL SCREEN: Sickle Cell Screen: NEGATIVE

## 2016-06-07 LAB — ANTIBODY SCREEN: ANTIBODY SCREEN: NEGATIVE

## 2016-06-07 LAB — THYROID PANEL WITH TSH
Free Thyroxine Index: 3 (ref 1.2–4.9)
T3 Uptake Ratio: 26 % (ref 24–39)
T4 TOTAL: 11.4 ug/dL (ref 4.5–12.0)
TSH: 0.695 u[IU]/mL (ref 0.450–4.500)

## 2016-06-07 LAB — RUBELLA SCREEN: RUBELLA: 3.01 {index} (ref >0.99)

## 2016-06-07 LAB — RPR: RPR: NONREACTIVE

## 2016-06-07 LAB — HEPATITIS B SURFACE ANTIGEN: Hepatitis B Surface Ag: NEGATIVE

## 2016-06-07 LAB — HIV ANTIBODY (ROUTINE TESTING W REFLEX): HIV Screen 4th Generation wRfx: NONREACTIVE

## 2016-06-07 LAB — VARICELLA ZOSTER ANTIBODY, IGG: Varicella zoster IgG: 1513 index (ref >165)

## 2016-06-08 ENCOUNTER — Inpatient Hospital Stay (HOSPITAL_COMMUNITY): Payer: Medicaid Other

## 2016-06-08 ENCOUNTER — Inpatient Hospital Stay (HOSPITAL_COMMUNITY)
Admission: AD | Admit: 2016-06-08 | Discharge: 2016-06-08 | Discharge status: Against Medical Advice | Payer: Medicaid Other | Source: Ambulatory Visit | Attending: Obstetrics and Gynecology | Admitting: Obstetrics and Gynecology

## 2016-06-08 ENCOUNTER — Encounter (HOSPITAL_COMMUNITY): Payer: Self-pay

## 2016-06-08 DIAGNOSIS — O200 Threatened abortion: Secondary | ICD-10-CM

## 2016-06-08 DIAGNOSIS — O4691 Antepartum hemorrhage, unspecified, first trimester: Secondary | ICD-10-CM | POA: Diagnosis present

## 2016-06-08 DIAGNOSIS — Z3A01 Less than 8 weeks gestation of pregnancy: Secondary | ICD-10-CM | POA: Insufficient documentation

## 2016-06-08 DIAGNOSIS — O209 Hemorrhage in early pregnancy, unspecified: Secondary | ICD-10-CM

## 2016-06-08 LAB — URINALYSIS, ROUTINE W REFLEX MICROSCOPIC
BILIRUBIN URINE: NEGATIVE
Glucose, UA: NEGATIVE mg/dL
KETONES UR: NEGATIVE mg/dL
Leukocytes, UA: NEGATIVE
Nitrite: NEGATIVE
PH: 6 (ref 5.0–8.0)
Protein, ur: NEGATIVE mg/dL

## 2016-06-08 LAB — URINE MICROSCOPIC-ADD ON: WBC, UA: NONE SEEN WBC/hpf (ref 0–5)

## 2016-06-08 LAB — CBC
HEMATOCRIT: 29.5 % — AB (ref 36.0–46.0)
HEMOGLOBIN: 10.1 g/dL — AB (ref 12.0–15.0)
MCH: 26 pg (ref 26.0–34.0)
MCHC: 34.2 g/dL (ref 30.0–36.0)
MCV: 76 fL — AB (ref 78.0–100.0)
PLATELETS: 142 10*3/uL — AB (ref 150–400)
RBC: 3.88 MIL/uL (ref 3.87–5.11)
RDW: 15.3 % (ref 11.5–15.5)
WBC: 8.9 10*3/uL (ref 4.0–10.5)

## 2016-06-08 LAB — HCG, QUANTITATIVE, PREGNANCY: hCG, Beta Chain, Quant, S: 2836 m[IU]/mL — ABNORMAL HIGH (ref <5)

## 2016-06-08 NOTE — MAU Note (Signed)
Patient presents to mau with c/o vaginal bleeding x1 week that started as spotting and increased to that of a "period". Endorses going through 4 pads today. +HPT; LMP July 25th. Reporting lower abdominal pain that is cramping in nature that started at the same time; tried tylenol for pain with no relief. Also states that she "passed out" around an hour ago at home; states she remembers feeling hot and woke up on the floor.

## 2016-06-08 NOTE — Discharge Instructions (Signed)

## 2016-06-08 NOTE — MAU Provider Note (Signed)
History     CSN: [id number]  Arrival date and time: 06/08/16 2037   First Provider Initiated Contact with Patient 06/08/16 2105      Chief Complaint  Patient presents with  . Vaginal Bleeding   Vaginal Bleeding  The patient's primary symptoms include pelvic pain and vaginal bleeding. This is a new problem. The current episode started today. The problem occurs intermittently. The problem has been gradually worsening. Pain severity now: 6/10. The problem affects both sides. She is pregnant. Associated symptoms include abdominal pain. Pertinent negatives include no chills, constipation, diarrhea, dysuria, fever, frequency, nausea, urgency or vomiting. The vaginal discharge was bloody. The vaginal bleeding is typical of menses. She has been passing clots (about the size of a plum.). She has not been passing tissue. Nothing aggravates the symptoms. She has tried acetaminophen for the symptoms. The treatment provided no relief. Her menstrual history has been regular (LMP 03/11/16 ).    Past Medical History:  Diagnosis Date  . Anemia    thalassemia Beta minor  . H/O chlamydia infection 2011  . Pregnancy   . Thalassemia, beta (HCC) 05/18/2014  . Worsening headaches     Past Surgical History:  Procedure Laterality Date  . NO PAST SURGERIES      Family History  Problem Relation Age of Onset  . Diabetes Paternal Grandmother   . Cancer Paternal Aunt     unknown cancer    Social History  Substance Use Topics  . Smoking status: Never Smoker  . Smokeless tobacco: Never Used  . Alcohol use No    Allergies: No Known Allergies  Prescriptions Prior to Admission  Medication Sig Dispense Refill Last Dose  . acetaminophen (TYLENOL) 325 MG tablet Take 650 mg by mouth every 6 (six) hours as needed.   06/08/2016 at 1500  . Prenatal Vit-Fe Fumarate-FA (MULTIVITAMIN-PRENATAL) 27-0.8 MG TABS tablet Take 1 tablet by mouth daily at 12 noon.   06/07/2016 at Unknown time    Review of Systems   Constitutional: Negative for chills and fever.  Gastrointestinal: Positive for abdominal pain. Negative for constipation, diarrhea, nausea and vomiting.  Genitourinary: Positive for pelvic pain and vaginal bleeding. Negative for dysuria, frequency and urgency.   Physical Exam   Blood pressure 123/68, pulse 118, temperature 98.1 F (36.7 C), temperature source Oral, resp. rate 16, weight 153 lb 1.3 oz (69.4 kg), last menstrual period 03/11/2016, SpO2 100 %, unknown if currently breastfeeding.  Physical Exam  Nursing note and vitals reviewed. Constitutional: She is oriented to person, place, and time. She appears well-developed and well-nourished. No distress.  HENT:  Head: Normocephalic.  Cardiovascular: Normal rate.   Respiratory: Effort normal.  GI: Soft. There is no tenderness. There is no rebound.  Genitourinary:  Genitourinary Comments: Scant amount of blood on pad   Neurological: She is alert and oriented to person, place, and time.  Skin: Skin is warm and dry.  Psychiatric: She has a normal mood and affect.   Results for orders placed or performed during the hospital encounter of 06/08/16 (from the past 24 hour(s))  Urinalysis, Routine w reflex microscopic (not at [facility])     Status: Abnormal   Collection Time: 06/08/16  8:43 PM  Result Value Ref Range   Color, Urine YELLOW YELLOW   APPearance CLEAR CLEAR   Specific Gravity, Urine <1.005 (L) 1.005 - 1.030   pH 6.0 5.0 - 8.0   Glucose, UA NEGATIVE NEGATIVE mg/dL   Hgb urine dipstick LARGE (A) NEGATIVE  Bilirubin Urine NEGATIVE NEGATIVE   Ketones, ur NEGATIVE NEGATIVE mg/dL   Protein, ur NEGATIVE NEGATIVE mg/dL   Nitrite NEGATIVE NEGATIVE   Leukocytes, UA NEGATIVE NEGATIVE  Urine microscopic-add on     Status: Abnormal   Collection Time: 06/08/16  8:43 PM  Result Value Ref Range   Squamous Epithelial / LPF 0-5 (A) NONE SEEN   WBC, UA NONE SEEN 0 - 5 WBC/hpf   RBC / HPF 6-30 0 - 5 RBC/hpf   Bacteria, UA RARE (A) NONE  SEEN  hCG, quantitative, pregnancy     Status: Abnormal   Collection Time: 06/08/16  9:16 PM  Result Value Ref Range   hCG, Beta Chain, Quant, S 2,836 (H) <5 mIU/mL  CBC     Status: Abnormal   Collection Time: 06/08/16  9:18 PM  Result Value Ref Range   WBC 8.9 4.0 - 10.5 K/uL   RBC 3.88 3.87 - 5.11 MIL/uL   Hemoglobin 10.1 (L) 12.0 - 15.0 g/dL   HCT 16.1 (L) 09.6 - 04.5 %   MCV 76.0 (L) 78.0 - 100.0 fL   MCH 26.0 26.0 - 34.0 pg   MCHC 34.2 30.0 - 36.0 g/dL   RDW 40.9 81.1 - 91.4 %   Platelets 142 (L) 150 - 400 K/uL    MAU Course  Procedures  MDM 2346: Patient states that she needs to leave. [place] has not been read by radiology at this time. Reviewed bleeding precautions. Patient has appointment in the morning at [facility]. Advised patient to go to this appointment so that they can look up her [place] results for her. Advised that miscarriage seems likely.   Assessment and Plan   1. Threatened abortion in first trimester   2. Vaginal bleeding in pregnancy, first trimester    Patient left AMA.  Bleeding precautions RX: none  Return to MAU as needed FU with OB as planned  Follow-up Information    [facility] .   Contact information: [address]  [address] [place] [address] [phone]           Tawnya Crook 06/08/2016, 9:08 PM

## 2016-06-12 ENCOUNTER — Ambulatory Visit (INDEPENDENT_AMBULATORY_CARE_PROVIDER_SITE_OTHER): Payer: Medicaid Other

## 2016-06-12 DIAGNOSIS — Z3689 Encounter for other specified antenatal screening: Secondary | ICD-10-CM | POA: Diagnosis not present

## 2016-06-12 LAB — PAP IG, CT-NG, RFX HPV ASCU
CHLAMYDIA, NUC. ACID AMP: NEGATIVE
Gonococcus by Nucleic Acid Amp: NEGATIVE
PAP Smear Comment: 0

## 2016-06-13 LAB — NUSWAB VAGINITIS PLUS (VG+)
CHLAMYDIA TRACHOMATIS, NAA: NEGATIVE
Candida albicans, NAA: NEGATIVE
Candida glabrata, NAA: POSITIVE — AB
NEISSERIA GONORRHOEAE, NAA: NEGATIVE
Trich vag by NAA: NEGATIVE

## 2016-06-18 ENCOUNTER — Encounter: Payer: Self-pay | Admitting: Obstetrics and Gynecology

## 2016-06-18 ENCOUNTER — Ambulatory Visit (INDEPENDENT_AMBULATORY_CARE_PROVIDER_SITE_OTHER): Payer: Medicaid Other | Admitting: Obstetrics and Gynecology

## 2016-06-18 VITALS — BP 99/61 | HR 109 | Ht 69.75 in | Wt 155.4 lb

## 2016-06-18 DIAGNOSIS — O039 Complete or unspecified spontaneous abortion without complication: Secondary | ICD-10-CM | POA: Diagnosis not present

## 2016-06-18 DIAGNOSIS — Z30011 Encounter for initial prescription of contraceptive pills: Secondary | ICD-10-CM | POA: Diagnosis not present

## 2016-06-18 DIAGNOSIS — B373 Candidiasis of vulva and vagina: Secondary | ICD-10-CM

## 2016-06-18 DIAGNOSIS — Z8742 Personal history of other diseases of the female genital tract: Secondary | ICD-10-CM

## 2016-06-18 DIAGNOSIS — Z87898 Personal history of other specified conditions: Secondary | ICD-10-CM

## 2016-06-18 DIAGNOSIS — B3731 Acute candidiasis of vulva and vagina: Secondary | ICD-10-CM

## 2016-06-18 MED ORDER — TERCONAZOLE 0.4 % VA CREA: 1.0000 | TOPICAL_CREAM | Freq: Every day | VAGINAL | 0 refills | Status: DC

## 2016-06-18 MED ORDER — NORETHINDRONE 0.35 MG PO TABS: 1.0000 | ORAL_TABLET | Freq: Every day | ORAL | 11 refills | Status: DC

## 2016-06-18 NOTE — Patient Instructions (Signed)
Colposcopy  Colposcopy is a procedure to examine your cervix and vagina, or the area around the outside of your vagina, for abnormalities or signs of disease. The procedure is done using a lighted microscope called a colposcope. Tissue samples may be collected during the colposcopy if your health care provider finds any unusual cells. A colposcopy may be done if a woman has:  · An abnormal Pap test. A Pap test is a medical test done to evaluate cells that are on the surface of the cervix.  · A Pap test result that is suggestive of human papillomavirus (HPV). This virus can cause genital warts and is linked to the development of cervical cancer.  · A sore on her cervix and the results of a Pap test were normal.  · Genital warts on the cervix or in or around the outside of the vagina.  · A mother who took the drug diethylstilbestrol (DES) while pregnant.  · Painful intercourse.  · Vaginal bleeding, especially after sexual intercourse.  LET YOUR HEALTH CARE PROVIDER KNOW ABOUT:  · Any allergies you have.  · All medicines you are taking, including vitamins, herbs, eye drops, creams, and over-the-counter medicines.  · Previous problems you or members of your family have had with the use of anesthetics.  · Any blood disorders you have.  · Previous surgeries you have had.  · Medical conditions you have.  RISKS AND COMPLICATIONS  Generally, a colposcopy is a safe procedure. However, as with any procedure, complications can occur. Possible complications include:  · Bleeding.  · Infection.  · Missed lesions.  BEFORE THE PROCEDURE   · Tell your health care provider if you have your menstrual period. A colposcopy typically is not done during menstruation.  · For 24 hours before the colposcopy, do not:    [place].    Use tampons.    Use medicines, creams, or suppositories in the vagina.    Have sexual intercourse.  PROCEDURE   During the procedure, you will be lying on your back with your feet in foot rests (stirrups). A warm  metal or plastic instrument (speculum) will be placed in your vagina to keep it open and to allow the health care provider to see the cervix. The colposcope will be placed outside the vagina. It will be used to magnify and examine the cervix, vagina, and the area around the outside of the vagina. A small amount of liquid solution will be placed on the area that is to be viewed. This solution will make it easier to see the abnormal cells. Your health care provider will use tools to suck out mucus and cells from the canal of the cervix. Then he or she will record the location of the abnormal areas.  If a biopsy is done during the procedure, a medicine will usually be given to numb the area (local anesthetic). You may feel mild pain or cramping while the biopsy is done. After the procedure, tissue samples collected during the biopsy will be sent to a lab for analysis.  AFTER THE PROCEDURE   You will be given instructions on when to follow up with your health care provider for your test results. It is important to keep your appointment.     This information is not intended to replace advice given to you by your health care provider. Make sure you discuss any questions you have with your health care provider.     Document Released: 10/25/2002 Document Revised: 04/06/2013 Document Reviewed: 03/03/2013    Elsevier Interactive Patient Education ©2016 [employer]

## 2016-06-18 NOTE — Progress Notes (Signed)
    GYNECOLOGY PROGRESS NOTE  Subjective:    Patient ID: Dorothy Young, female    DOB: [birth year], 25 y.o.   MRN: [id number]  HPI  Patient is a 25 y.o. [id number] female who presents for f/u after ultrasound.  Patient recently diagnosed with inevitable miscarriage last week.  Presents for follow up. Patient currently notes that bleeding is very light (mostly spotting), and denies any pain.   The following portions of the patient's history were reviewed and updated as appropriate: allergies, current medications, past family history, past medical history, past social history, past surgical history and problem list.  Review of Systems Pertinent items noted in HPI and remainder of comprehensive ROS otherwise negative.   Objective:   Blood pressure 99/61, pulse (!) 109, height 5' 9.75" (1.772 m), weight 155 lb 6.4 oz (70.5 kg), last menstrual period 03/11/2016, unknown if currently breastfeeding. General appearance: alert and no distress Abdomen: soft, non-tender; bowel sounds normal; no masses,  no organomegaly Pelvic: deferred Extremities: extremities normal, atraumatic, no cyanosis or edema Neurologic: Grossly normal    Imaging:  ULTRASOUND REPORT  Location: [facility] Date of Service: 06/13/16  Indications:Threatened AB Findings:  No IUP Identified.  Previous US on 06/08/16 showing gestational sac without fetal pole.  Right Ovary measures 3.5 x 1.8 x 3 cm. It is normal in appearance. Left Ovary measures 3.8 x 2.3 x 2.1 cm. It is normal appearance. There is evidence of a corpus luteal cyst in the Left Survey of the adnexa demonstrates no adnexal masses. There is no free peritoneal fluid in the cul de sac.  Impression: 1. IUP not seen  Recommendations: 1.Clinical correlation with the patient's History and Physical Exam.   Assessment:   Miscarriage completed Contraception management H/o abnormal pap smear (HGSIL) Vaginal yeast infection (Candida on  pap smear)  Plan:   - Patient status post complete miscarriage. We'll recheck beta hCG and follow trend back down to negative range. - Advise patient that if she desires to attempt conception again, to wait at least 3 months to allow the proper healing time. Patient notes that she does not desire conception after current pregnancy. Discuss contraception options with patient.  Reviewed other forms of birth control options available including abstinence; over the counter/barrier methods; hormonal contraceptive medication including pill, patch, ring, injection,contraceptive implant; hormonal and nonhormonal IUDs;  Risks and benefits reviewed.  Questions were answered.  patient desires contraceptive pill. Desires to get started right away. Discussed that because patient was recently pregnant, she should not take combined OCPs for an additional 2-3 weeks due to the increased risk of thromboembolic events. Will prescribe minipill. Discussed importance of taking medication at the same time every day. Patient notes understanding. - Patient with recent history of abnormal Pap smear (HGSIL). Discussed the need for colposcopy.  Will have patient scheduled for 2 weeks. - Candidate was noted on recent Pap smear. Discussed with patient need for treatment of yeast infection prior to colposcopy. Will prescribe Terconazole cream.  A total of 15 minutes were spent face-to-face with the patient during this encounter and over half of that time dealt with counseling and coordination of care.    Hildred LaserAnika Marce Charlesworth, MD [facility]

## 2016-06-19 LAB — BETA HCG QUANT (REF LAB): hCG Quant: 49 m[IU]/mL

## 2016-06-20 ENCOUNTER — Encounter: Payer: Self-pay | Admitting: Obstetrics and Gynecology

## 2016-06-20 ENCOUNTER — Telehealth: Payer: Self-pay

## 2016-06-20 NOTE — Telephone Encounter (Signed)
-----   Message from Dorothy LaserAnika Cherry, MD sent at 06/20/2016  9:42 AM EDT ----- Please inform patient that her hormone levels are trending down.  We will recheck it again in 2 weeks when she comes in for her colposcopy.

## 2016-06-20 NOTE — Telephone Encounter (Signed)
Called pt no answer. LM for pt informing her of the information below.  

## 2016-07-03 ENCOUNTER — Encounter: Payer: Medicaid Other | Admitting: Obstetrics and Gynecology

## 2016-08-05 ENCOUNTER — Encounter: Payer: Medicaid Other | Admitting: Obstetrics and Gynecology

## 2016-08-13 ENCOUNTER — Encounter: Payer: Medicaid Other | Admitting: Obstetrics and Gynecology

## 2016-10-16 ENCOUNTER — Encounter: Payer: Self-pay | Admitting: Obstetrics and Gynecology

## 2016-10-16 ENCOUNTER — Ambulatory Visit (INDEPENDENT_AMBULATORY_CARE_PROVIDER_SITE_OTHER): Payer: Medicaid Other | Admitting: Obstetrics and Gynecology

## 2016-10-16 VITALS — BP 117/73 | HR 91 | Ht 69.75 in | Wt 151.8 lb

## 2016-10-16 DIAGNOSIS — R87613 High grade squamous intraepithelial lesion on cytologic smear of cervix (HGSIL): Secondary | ICD-10-CM | POA: Diagnosis not present

## 2016-10-16 NOTE — Progress Notes (Signed)
    GYNECOLOGY CLINIC COLPOSCOPY PROCEDURE NOTE  26 y.o. [id number] here for colposcopy for high-grade squamous intraepithelial neoplasia  (HGSIL-encompassing moderate and severe dysplasia) pap smear on 05/26/2016. Discussed role for HPV in cervical dysplasia, need for surveillance.  Patient given informed consent.  Placed in lithotomy position. Cervix viewed with speculum and colposcope after application of acetic acid.   Colposcopy adequate? Yes  acetowhite lesion(s) noted at 12-2 o'clock and 6 o'clock; corresponding biopsies obtained.  ECC specimen obtained. All specimens were labeled and sent to pathology.   Patient was given post procedure instructions.  Will follow up pathology and manage accordingly.  Routine preventative health maintenance measures emphasized.    Hildred LaserAnika Rolin Schult, MD [facility]

## 2016-10-21 LAB — PATHOLOGY

## 2016-10-22 ENCOUNTER — Encounter: Payer: Self-pay | Admitting: Obstetrics and Gynecology

## 2016-10-22 ENCOUNTER — Telehealth: Payer: Self-pay

## 2016-10-22 NOTE — Telephone Encounter (Signed)
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  Will need repeat pap smear and colposcopy in 6 months and 12 months.

## 2016-10-22 NOTE — Telephone Encounter (Signed)
Called pt no answer. LM for pt informing her of results below. Will also mail patient education.

## 2017-03-04 ENCOUNTER — Ambulatory Visit (INDEPENDENT_AMBULATORY_CARE_PROVIDER_SITE_OTHER): Payer: Medicaid Other | Admitting: Obstetrics and Gynecology

## 2017-03-04 ENCOUNTER — Encounter: Payer: Self-pay | Admitting: Obstetrics and Gynecology

## 2017-03-04 VITALS — BP 100/63 | HR 99 | Ht 70.0 in | Wt 159.1 lb

## 2017-03-04 DIAGNOSIS — N871 Moderate cervical dysplasia: Secondary | ICD-10-CM | POA: Diagnosis not present

## 2017-03-04 DIAGNOSIS — Z3201 Encounter for pregnancy test, result positive: Secondary | ICD-10-CM | POA: Diagnosis not present

## 2017-03-04 DIAGNOSIS — N912 Amenorrhea, unspecified: Secondary | ICD-10-CM | POA: Diagnosis not present

## 2017-03-04 DIAGNOSIS — D563 Thalassemia minor: Secondary | ICD-10-CM

## 2017-03-04 DIAGNOSIS — O09291 Supervision of pregnancy with other poor reproductive or obstetric history, first trimester: Secondary | ICD-10-CM | POA: Diagnosis not present

## 2017-03-04 DIAGNOSIS — D069 Carcinoma in situ of cervix, unspecified: Secondary | ICD-10-CM | POA: Insufficient documentation

## 2017-03-04 HISTORY — DX: Carcinoma in situ of cervix, unspecified: D06.9

## 2017-03-04 LAB — POCT URINE PREGNANCY: Preg Test, Ur: POSITIVE — AB

## 2017-03-04 NOTE — Progress Notes (Signed)
    GYNECOLOGY CLINIC PROGRESS NOTE  Subjective:    Dorothy Young is a 26 y.o. [id number] female who presents for evaluation of amenorrhea. She believes she could be pregnant. Pregnancy is desired. Sexual Activity: single partner, contraception: none. Current symptoms also include: fatigue, nausea and increased appetite. Last period was normal.  Patient's last menstrual period was 01/06/2017.  The following portions of the patient's history were reviewed and updated as appropriate:    OB History  Gravida Para Term Preterm AB Living  6 3 3   2 3   SAB TAB Ectopic Multiple Live Births  1     0 3    # Outcome Date GA Lbr Len/2nd Weight Sex Delivery Anes PTL Lv  6 Current           5 SAB 06/08/16 6817w6d         4 Term 08/30/14 21317w6d 01:44 / 00:23 6 lb 13 oz (3.09 kg) M Vag-Spont None  LIV  3 AB 2012 694w0d    TAB     2 Term 07/22/10 488w0d  6 lb 8 oz (2.948 kg) F Vag-Spont None N LIV  1 Term 09/15/08 10388w0d  6 lb 7 oz (2.92 kg) F Vag-Spont None N LIV      She  has a past medical history of Anemia; H/O chlamydia infection (2011); Pregnancy; Thalassemia Trait, beta (HCC) (05/18/2014); and Worsening headaches.  She  has a past surgical history that includes No past surgeries.   Her family history includes Cancer in her paternal aunt; Diabetes in her paternal grandmother.   She  reports that she has never smoked. She has never used smokeless tobacco. She reports that she does not drink alcohol or use drugs.   Medications: Prenatal vitamins  She has No Known Allergies..  Review of Systems Pertinent items noted in HPI and remainder of comprehensive ROS otherwise negative.     Objective:    BP 100/63 (BP Location: Left Arm, Patient Position: Sitting, Cuff Size: Normal)   Pulse 99   Ht 5\' 10"  (1.778 m)   Wt 159 lb 1.6 oz (72.2 kg)   LMP 01/06/2017   BMI 22.83 kg/m  General: alert and no acute distress    Lab Review Urine HCG: positive    Assessment:    Absence of  menstruation.  Positive pregnancy test .  Beta thalassemia trait H/o CIN II H/o miscarriage  Plan:   - Pregnancy Test: Positive: EDC: 10/13/2016, with EGA 8.1 weeks. . Briefly discussed pre-natal care options.  Encouraged well-balanced diet, plenty of rest when needed, pre-natal vitamins daily and walking for exercise. Discussed self-help for nausea, avoiding OTC medications until consulting provider or pharmacist, other than Tylenol as needed, minimal caffeine (1-2 cups daily) and avoiding alcohol. She will schedule her initial OB visit in the next month, NOB intake in 2 weeks. Feel free to call with any questions.   - Beta thalassemia minor, partner should be tested if he has not been already.  - H/o CIN II, will need repeat pap smear (and possibly colposcopy) at 6 month mark which will be in September with NOB visit.  - H/o first trimester miscarriage at 7.6 weeks  in November 2017.  Will confirm viability with current pregancy.    Dorothy Young, Dorothy Roylance, MD [facility]

## 2017-03-20 ENCOUNTER — Ambulatory Visit (INDEPENDENT_AMBULATORY_CARE_PROVIDER_SITE_OTHER): Payer: Medicaid Other

## 2017-03-20 ENCOUNTER — Ambulatory Visit (INDEPENDENT_AMBULATORY_CARE_PROVIDER_SITE_OTHER): Payer: Medicaid Other | Admitting: Obstetrics and Gynecology

## 2017-03-20 VITALS — BP 115/76 | HR 95 | Ht 70.0 in | Wt 157.2 lb

## 2017-03-20 DIAGNOSIS — O09291 Supervision of pregnancy with other poor reproductive or obstetric history, first trimester: Secondary | ICD-10-CM

## 2017-03-20 DIAGNOSIS — Z1389 Encounter for screening for other disorder: Secondary | ICD-10-CM

## 2017-03-20 DIAGNOSIS — Z3481 Encounter for supervision of other normal pregnancy, first trimester: Secondary | ICD-10-CM

## 2017-03-20 DIAGNOSIS — Z113 Encounter for screening for infections with a predominantly sexual mode of transmission: Secondary | ICD-10-CM

## 2017-03-20 NOTE — Patient Instructions (Signed)
Pregnancy and Zika Virus Disease Zika virus disease, or Zika, is an illness that can spread to people from mosquitoes that carry the virus. It may also spread from person to person through infected body fluids. Zika first occurred in [place], but recently it has spread to new areas. The virus occurs in tropical climates. The location of Zika continues to change. Most people who become infected with Zika virus do not develop serious illness. However, Zika may cause birth defects in an unborn baby whose mother is infected with the virus. It may also increase the risk of miscarriage. What are the symptoms of Zika virus disease? In many cases, people who have been infected with Zika virus do not develop any symptoms. If symptoms appear, they usually start about a week after the person is infected. Symptoms are usually mild. They may include:  Fever.  Rash.  Red eyes.  Joint pain.  How does Zika virus disease spread? The main way that Zika virus spreads is through the bite of a certain type of mosquito. Unlike most types of mosquitos, which bite only at night, the type of mosquito that carries Zika virus bites both at night and during the day. Zika virus can also spread through sexual contact, through a blood transfusion, and from a mother to her baby before or during birth. Once you have had Zika virus disease, it is unlikely that you will get it again. Can I pass Zika to my baby during pregnancy? Yes, Zika can pass from a mother to her baby before or during birth. What problems can Zika cause for my baby? A woman who is infected with Zika virus while pregnant is at risk of having her baby born with a condition in which the brain or head is smaller than expected (microcephaly). Babies who have microcephaly can have developmental delays, seizures, hearing problems, and vision problems. Having Zika virus disease during pregnancy can also increase the risk of miscarriage. How can Zika virus disease be  prevented? There is no vaccine to prevent Zika. The best way to prevent the disease is to avoid infected mosquitoes and avoid exposure to body fluids that can spread the virus. Avoid any possible exposure to Zika by taking the following precautions. For women and their sex partners:  Avoid traveling to high-risk areas. The locations where Zika is being reported change often. To identify high-risk areas, check the CDC travel website: www.cdc.gov/zika/geo/index.html  If you or your sex partner must travel to a high-risk area, talk with a health care provider before and after traveling.  Take all precautions to avoid mosquito bites if you live in, or travel to, any of the high-risk areas. Insect repellents are safe to use during pregnancy.  Ask your health care provider when it is safe to have sexual contact.  For women:  If you are pregnant or trying to become pregnant, avoid sexual contact with persons who may have been exposed to Zika virus, persons who have possible symptoms of Zika, or persons whose history you are unsure about. If you choose to have sexual contact with someone who may have been exposed to Zika virus, use condoms correctly during the entire duration of sexual activity, every time. Do not share sexual devices, as you may be exposed to body fluids.  Ask your health care provider about when it is safe to attempt pregnancy after a possible exposure to Zika virus.  What steps should I take to avoid mosquito bites? Take these steps to avoid mosquito bites   when you are in a high-risk area:  Wear loose clothing that covers your arms and legs.  Limit your outdoor activities.  Do not open windows unless they have window screens.  Sleep under mosquito nets.  Use insect repellent. The best insect repellents have:  DEET, picaridin, oil of lemon eucalyptus (OLE), or [id number] in them.  Higher amounts of an active ingredient in them.  Remember that insect repellents are safe to  use during pregnancy.  Do not use OLE on children who are younger than 143 years of age. Do not use insect repellent on babies who are younger than 92 months of age.  Cover your child's stroller with mosquito netting. Make sure the netting fits snugly and that any loose netting does not cover your child's mouth or nose. Do not use a blanket as a mosquito-protection cover.  Do not apply insect repellent underneath clothing.  If you are using sunscreen, apply the sunscreen before applying the insect repellent.  Treat clothing with permethrin. Do not apply permethrin directly to your skin. Follow label directions for safe use.  Get rid of standing water, where mosquitoes may reproduce. Standing water is often found in items such as buckets, bowls, animal food dishes, and flowerpots.  When you return from traveling to any high-risk area, continue taking actions to protect yourself against mosquito bites for 3 weeks, even if you show no signs of illness. This will prevent spreading Zika virus to uninfected mosquitoes. What should I know about the sexual transmission of Zika? People can spread Zika to their sexual partners during vaginal, anal, or oral sex, or by sharing sexual devices. Many people with [place] do not develop symptoms, so a person could spread the disease without knowing that they are infected. The greatest risk is to women who are pregnant or who may become pregnant. Zika virus can live longer in semen than it can live in blood. Couples can prevent sexual transmission of the virus by:  Using condoms correctly during the entire duration of sexual activity, every time. This includes vaginal, anal, and oral sex.  Not sharing sexual devices. Sharing increases your risk of being exposed to body fluid from another person.  Avoiding all sexual activity until your health care provider says it is safe.  Should I be tested for Zika virus? A sample of your blood can be tested for Zika virus. A  pregnant woman should be tested if she may have been exposed to the virus or if she has symptoms of Zika. She may also have additional tests done during her pregnancy, such ultrasound testing. Talk with your health care provider about which tests are recommended. This information is not intended to replace advice given to you by your health care provider. Make sure you discuss any questions you have with your health care provider. Document Released: 04/25/2015 Document Revised: 01/10/2016 Document Reviewed: 04/18/2015 Elsevier Interactive Patient Education  2018 [employer] First Trimester of Pregnancy The first trimester of pregnancy is from week 1 until the end of week 13 (months 1 through 3). During this time, your baby will begin to develop inside you. At 6-8 weeks, the eyes and face are formed, and the heartbeat can be seen on ultrasound. At the end of 12 weeks, all the baby's organs are formed. Prenatal care is all the medical care you receive before the birth of your baby. Make sure you get good prenatal care and follow all of your doctor's instructions. Follow these instructions at home: Medicines  Take over-the-counter and prescription medicines only as told by your doctor. Some medicines are safe and some medicines are not safe during pregnancy.  Take a prenatal vitamin that contains at least 600 micrograms (mcg) of folic acid.  If you have trouble pooping (constipation), take medicine that will make your stool soft (stool softener) if your doctor approves. Eating and drinking  Eat regular, healthy meals.  Your doctor will tell you the amount of weight gain that is right for you.  Avoid raw meat and uncooked cheese.  If you feel sick to your stomach (nauseous) or throw up (vomit): ? Eat 4 or 5 small meals a day instead of 3 large meals. ? Try eating a few soda crackers. ? Drink liquids between meals instead of during meals.  To prevent constipation: ? Eat foods that are high  in fiber, like fresh fruits and vegetables, whole grains, and beans. ? Drink enough fluids to keep your pee (urine) clear or pale yellow. Activity  Exercise only as told by your doctor. Stop exercising if you have cramps or pain in your lower belly (abdomen) or low back.  Do not exercise if it is too hot, too humid, or if you are in a place of great height (high altitude).  Try to avoid standing for long periods of time. Move your legs often if you must stand in one place for a long time.  Avoid heavy lifting.  Wear low-heeled shoes. Sit and stand up straight.  You can have sex unless your doctor tells you not to. Relieving pain and discomfort  Wear a good support bra if your breasts are sore.  Take warm water baths (sitz baths) to soothe pain or discomfort caused by hemorrhoids. Use hemorrhoid cream if your doctor says it is okay.  Rest with your legs raised if you have leg cramps or low back pain.  If you have puffy, bulging veins (varicose veins) in your legs: ? Wear support hose or compression stockings as told by your doctor. ? Raise (elevate) your feet for 15 minutes, 3-4 times a day. ? Limit salt in your food. Prenatal care  Schedule your prenatal visits by the twelfth week of pregnancy.  Write down your questions. Take them to your prenatal visits.  Keep all your prenatal visits as told by your doctor. This is important. Safety  Wear your seat belt at all times when driving.  Make a list of emergency phone numbers. The list should include numbers for family, friends, the hospital, and police and fire departments. General instructions  Ask your doctor for a referral to a local prenatal class. Begin classes no later than at the start of month 6 of your pregnancy.  Ask for help if you need counseling or if you need help with nutrition. Your doctor can give you advice or tell you where to go for help.  Do not use hot tubs, steam rooms, or saunas.  Do not douche or  use tampons or scented sanitary pads.  Do not cross your legs for long periods of time.  Avoid all herbs and alcohol. Avoid drugs that are not approved by your doctor.  Do not use any tobacco products, including cigarettes, chewing tobacco, and electronic cigarettes. If you need help quitting, ask your doctor. You may get counseling or other support to help you quit.  Avoid cat litter boxes and soil used by cats. These carry germs that can cause birth defects in the baby and can cause a loss of  your baby (miscarriage) or stillbirth.  Visit your dentist. At home, brush your teeth with a soft toothbrush. Be gentle when you floss. Contact a doctor if:  You are dizzy.  You have mild cramps or pressure in your lower belly.  You have a nagging pain in your belly area.  You continue to feel sick to your stomach, you throw up, or you have watery poop (diarrhea).  You have a bad smelling fluid coming from your vagina.  You have pain when you pee (urinate).  You have increased puffiness (swelling) in your face, hands, legs, or ankles. Get help right away if:  You have a fever.  You are leaking fluid from your vagina.  You have spotting or bleeding from your vagina.  You have very bad belly cramping or pain.  You gain or lose weight rapidly.  You throw up blood. It may look like coffee grounds.  You are around people who have [place] measles, fifth disease, or chickenpox.  You have a very bad headache.  You have shortness of breath.  You have any kind of trauma, such as from a fall or a car accident. Summary  The first trimester of pregnancy is from week 1 until the end of week 13 (months 1 through 3).  To take care of yourself and your unborn baby, you will need to eat healthy meals, take medicines only if your doctor tells you to do so, and do activities that are safe for you and your baby.  Keep all follow-up visits as told by your doctor. This is important as your doctor  will have to ensure that your baby is healthy and growing well. This information is not intended to replace advice given to you by your health care provider. Make sure you discuss any questions you have with your health care provider. Document Released: 01/21/2008 Document Revised: 08/12/2016 Document Reviewed: 08/12/2016 Elsevier Interactive Patient Education  2017 Elsevier Inc. Commonly Asked Questions During Pregnancy  Cats: A parasite can be excreted in cat feces.  To avoid exposure you need to have another person empty the little box.  If you must empty the litter box you will need to wear gloves.  Wash your hands after handling your cat.  This parasite can also be found in raw or undercooked meat so this should also be avoided.  Colds, Sore Throats, Flu: Please check your medication sheet to see what you can take for symptoms.  If your symptoms are unrelieved by these medications please call the office.  Dental Work: Most any dental work Agricultural consultantyour dentist recommends is permitted.  X-rays should only be taken during the first trimester if absolutely necessary.  Your abdomen should be shielded with a lead apron during all x-rays.  Please notify your provider prior to receiving any x-rays.  Novocaine is fine; gas is not recommended.  If your dentist requires a note from us prior to dental work please call the office and we will provide one for you.  Exercise: Exercise is an important part of staying healthy during your pregnancy.  You may continue most exercises you were accustomed to prior to pregnancy.  Later in your pregnancy you will most likely notice you have difficulty with activities requiring balance like riding a bicycle.  It is important that you listen to your body and avoid activities that put you at a higher risk of falling.  Adequate rest and staying well hydrated are a must!  If you have questions about the safety  of specific activities ask your provider.    Exposure to Children with  illness: Try to avoid obvious exposure; report any symptoms to [place] when noted,  If you have chicken pos, red measles or mumps, you should be immune to these diseases.   Please do not take any vaccines while pregnant unless you have checked with your OB provider.  Fetal Movement: After 28 weeks we recommend you do "kick counts" twice daily.  Lie or sit down in a calm quiet environment and count your baby movements "kicks".  You should feel your baby at least 10 times per hour.  If you have not felt 10 kicks within the first hour get up, walk around and have something sweet to eat or drink then repeat for an additional hour.  If count remains less than 10 per hour notify your provider.  Fumigating: Follow your pest control agent's advice as to how long to stay out of your home.  Ventilate the area well before re-entering.  Hemorrhoids:   Most over-the-counter preparations can be used during pregnancy.  Check your medication to see what is safe to use.  It is important to use a stool softener or fiber in your diet and to drink lots of liquids.  If hemorrhoids seem to be getting worse please call the office.   Hot Tubs:  Hot tubs Jacuzzis and saunas are not recommended while pregnant.  These increase your internal body temperature and should be avoided.  Intercourse:  Sexual intercourse is safe during pregnancy as long as you are comfortable, unless otherwise advised by your provider.  Spotting may occur after intercourse; report any bright red bleeding that is heavier than spotting.  Labor:  If you know that you are in labor, please go to the hospital.  If you are unsure, please call the office and let us help you decide what to do.  Lifting, straining, etc:  If your job requires heavy lifting or straining please check with your provider for any limitations.  Generally, you should not lift items heavier than that you can lift simply with your hands and arms (no back muscles)  Painting:  Paint fumes do  not harm your pregnancy, but may make you ill and should be avoided if possible.  Latex or water based paints have less odor than oils.  Use adequate ventilation while painting.  Permanents & Hair Color:  Chemicals in hair dyes are not recommended as they cause increase hair dryness which can increase hair loss during pregnancy.  " Highlighting" and permanents are allowed.  Dye may be absorbed differently and permanents may not hold as well during pregnancy.  Sunbathing:  Use a sunscreen, as skin burns easily during pregnancy.  Drink plenty of fluids; avoid over heating.  Tanning Beds:  Because their possible side effects are still unknown, tanning beds are not recommended.  Ultrasound Scans:  Routine ultrasounds are performed at approximately 20 weeks.  You will be able to see your baby's general anatomy an if you would like to know the gender this can usually be determined as well.  If it is questionable when you conceived you may also receive an ultrasound early in your pregnancy for dating purposes.  Otherwise ultrasound exams are not routinely performed unless there is a medical necessity.  Although you can request a scan we ask that you pay for it when conducted because insurance does not cover " patient request" scans.  Work: If your pregnancy proceeds without complications you may work  until your due date, unless your physician or employer advises otherwise.  Round Ligament Pain/Pelvic Discomfort:  Sharp, shooting pains not associated with bleeding are fairly common, usually occurring in the second trimester of pregnancy.  They tend to be worse when standing up or when you remain standing for long periods of time.  These are the result of pressure of certain pelvic ligaments called "round ligaments".  Rest, Tylenol and heat seem to be the most effective relief.  As the womb and fetus grow, they rise out of the pelvis and the discomfort improves.  Please notify the office if your pain seems  different than that described.  It may represent a more serious condition.

## 2017-03-20 NOTE — Progress Notes (Signed)
Dorothy Young presents for NOB nurse interview visit. Pregnancy confirmation done on 03/04/2017. UPT: positive. LMP: 01/06/2017. Has dating ultrasound this pm.  G-6.  [id number]. Pregnancy education material explained and given. No cats in the home. NOB labs ordered. HIV labs and Drug screen were explained optional and she did not decline. Drug screen ordered. PNV encouraged. Genetic screening options discussed. Genetic testing: Unsure.  Pt may discuss with provider. Pt. To follow up with provider in 2 weeks for NOB physical.  All questions answered.

## 2017-03-21 NOTE — Progress Notes (Signed)
I have reviewed the record and concur with patient management and plan.  Dayton Kenley, MD [facility]     

## 2017-03-22 LAB — CBC WITH DIFFERENTIAL/PLATELET
BASOS ABS: 0 10*3/uL (ref 0.0–0.2)
Basos: 1 %
EOS (ABSOLUTE): 0.2 10*3/uL (ref 0.0–0.4)
Eos: 3 %
HEMATOCRIT: 35.4 % (ref 34.0–46.6)
HEMOGLOBIN: 11.9 g/dL (ref 11.1–15.9)
IMMATURE GRANS (ABS): 0 10*3/uL (ref 0.0–0.1)
Immature Granulocytes: 0 %
LYMPHS ABS: 1.4 10*3/uL (ref 0.7–3.1)
LYMPHS: 22 %
MCH: 25.6 pg — AB (ref 26.6–33.0)
MCHC: 33.6 g/dL (ref 31.5–35.7)
MCV: 76 fL — ABNORMAL LOW (ref 79–97)
Monocytes Absolute: 0.7 10*3/uL (ref 0.1–0.9)
Monocytes: 12 %
NEUTROS ABS: 3.9 10*3/uL (ref 1.4–7.0)
NEUTROS PCT: 62 %
Platelets: 196 10*3/uL (ref 150–379)
RBC: 4.64 x10E6/uL (ref 3.77–5.28)
RDW: 16.2 % — ABNORMAL HIGH (ref 12.3–15.4)
WBC: 6.2 10*3/uL (ref 3.4–10.8)

## 2017-03-22 LAB — CULTURE, OB URINE

## 2017-03-22 LAB — HEPATITIS B SURFACE ANTIGEN: HEP B S AG: NEGATIVE

## 2017-03-22 LAB — RPR: RPR: NONREACTIVE

## 2017-03-22 LAB — ABO

## 2017-03-22 LAB — URINE CULTURE, OB REFLEX

## 2017-03-22 LAB — VARICELLA ZOSTER ANTIBODY, IGG: Varicella zoster IgG: 1921 index (ref >165)

## 2017-03-22 LAB — RH TYPE: RH TYPE: POSITIVE

## 2017-03-22 LAB — RUBELLA SCREEN: RUBELLA: 2.49 {index} (ref >0.99)

## 2017-03-22 LAB — HIV ANTIBODY (ROUTINE TESTING W REFLEX): HIV Screen 4th Generation wRfx: NONREACTIVE

## 2017-03-22 LAB — ANTIBODY SCREEN: ANTIBODY SCREEN: NEGATIVE

## 2017-03-22 LAB — SICKLE CELL SCREEN: SICKLE CELL SCREEN: NEGATIVE

## 2017-03-23 LAB — GC/CHLAMYDIA PROBE AMP
Chlamydia trachomatis, NAA: NEGATIVE
Neisseria gonorrhoeae by PCR: NEGATIVE

## 2017-03-24 LAB — MONITOR DRUG PROFILE 14(MW)
Amphetamine Scrn, Ur: NEGATIVE ng/mL
BARBITURATE SCREEN URINE: NEGATIVE ng/mL
BENZODIAZEPINE SCREEN, URINE: NEGATIVE ng/mL
BUPRENORPHINE, URINE: NEGATIVE ng/mL
CANNABINOIDS UR QL SCN: NEGATIVE ng/mL
CREATININE(CRT), U: 64.1 mg/dL (ref 20.0–300.0)
Cocaine (Metab) Scrn, Ur: NEGATIVE ng/mL
Fentanyl, Urine: NEGATIVE pg/mL
METHADONE SCREEN, URINE: NEGATIVE ng/mL
Meperidine Screen, Urine: NEGATIVE ng/mL
OXYCODONE+OXYMORPHONE UR QL SCN: NEGATIVE ng/mL
Opiate Scrn, Ur: NEGATIVE ng/mL
PH UR, DRUG SCRN: 5.8 (ref 4.5–8.9)
PHENCYCLIDINE QUANTITATIVE URINE: NEGATIVE ng/mL
PROPOXYPHENE SCREEN URINE: NEGATIVE ng/mL
SPECIFIC GRAVITY: 1.01
Tramadol Screen, Urine: NEGATIVE ng/mL

## 2017-03-24 LAB — NICOTINE SCREEN, URINE: Cotinine Ql Scrn, Ur: NEGATIVE ng/mL

## 2017-03-24 LAB — URINALYSIS, ROUTINE W REFLEX MICROSCOPIC
Bilirubin, UA: NEGATIVE
GLUCOSE, UA: NEGATIVE
LEUKOCYTES UA: NEGATIVE
Nitrite, UA: NEGATIVE
RBC, UA: NEGATIVE
Urobilinogen, Ur: 0.2 mg/dL (ref 0.2–1.0)
pH, UA: 5.5 (ref 5.0–7.5)

## 2017-04-01 ENCOUNTER — Ambulatory Visit (INDEPENDENT_AMBULATORY_CARE_PROVIDER_SITE_OTHER): Payer: Medicaid Other | Admitting: Obstetrics and Gynecology

## 2017-04-01 VITALS — BP 124/76 | HR 90 | Wt 156.4 lb

## 2017-04-01 DIAGNOSIS — D563 Thalassemia minor: Secondary | ICD-10-CM

## 2017-04-01 DIAGNOSIS — O09291 Supervision of pregnancy with other poor reproductive or obstetric history, first trimester: Secondary | ICD-10-CM

## 2017-04-01 DIAGNOSIS — E049 Nontoxic goiter, unspecified: Secondary | ICD-10-CM

## 2017-04-01 DIAGNOSIS — Z3481 Encounter for supervision of other normal pregnancy, first trimester: Secondary | ICD-10-CM | POA: Diagnosis not present

## 2017-04-01 DIAGNOSIS — Z1379 Encounter for other screening for genetic and chromosomal anomalies: Secondary | ICD-10-CM

## 2017-04-01 DIAGNOSIS — N871 Moderate cervical dysplasia: Secondary | ICD-10-CM

## 2017-04-01 NOTE — Progress Notes (Signed)
OBSTETRIC INITIAL PRENATAL VISIT  Subjective:    Dorothy Young is being seen today for her first obstetrical visit.  This is a planned pregnancy. She is a [id number] female at 8333w1d gestation, Estimated Date of Delivery: 10/13/17 with Patient's last menstrual period was 01/06/2017 (exact date), consistent with 10 week sono. Her obstetrical history is significant for none. Relationship with FOB: significant other, living together. Patient does intend to breast feed. Pregnancy history fully reviewed.    Obstetric History   G6   P3   T3   P0   A2   L3    SAB1   TAB0   Ectopic0   Multiple0   Live Births3     # Outcome Date GA Lbr Len/2nd Weight Sex Delivery Anes PTL Lv  6 Current           5 SAB 06/08/16 2950w6d         4 Term 08/30/14 68350w6d 01:44 / 00:23 6 lb 13 oz (3.09 kg) M Vag-Spont None  LIV     Apgar1:  8                Apgar5: 9  3 AB 2012 6863w0d    TAB     2 Term 07/22/10 8448w0d  6 lb 8 oz (2.948 kg) F Vag-Spont None N LIV  1 Term 09/15/08 6248w0d  6 lb 7 oz (2.92 kg) F Vag-Spont None N LIV      Gynecologic History:  Last pap smear was 06/05/2016.  Results were abnormal (HGSIL).  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 H/o prior abnormal pap smear in the past (LGSIL).  Reports history of STIs: Chlamydia in 2011.    Past Medical History:  Diagnosis Date  . Anemia    thalassemia Beta minor  . H/O chlamydia infection 2011  . Pregnancy   . Thalassemia, beta (HCC) 05/18/2014  . Worsening headaches      Family History  Problem Relation Age of Onset  . Diabetes Paternal Grandmother   . Cancer Paternal Aunt        unknown cancer    Past Surgical History:  Procedure Laterality Date  . NO PAST SURGERIES      Social History   Social History  . Marital status: Single    Spouse name: N/A  . Number of children: 2  . Years of education: 11   Occupational History  .  Unemployed   Social History Main Topics  . Smoking status: Never Smoker  . Smokeless  tobacco: Never Used  . Alcohol use No  . Drug use: No  . Sexual activity: Yes    Birth control/ protection: None   Other Topics Concern  . Not on file   Social History Narrative   Patient is single and lives with her mother.   Patient has two children.   Patient is not currently not working.   Patient has a high school education.   Patient is right-handed.   Patient drinks very little soda- not everyday.     Current Outpatient Prescriptions on File Prior to Visit  Medication Sig Dispense Refill  . Prenatal Vit-Fe Fumarate-FA (MULTIVITAMIN-PRENATAL) 27-0.8 MG TABS tablet Take 1 tablet by mouth daily at 12 noon.     No current facility-administered medications on file prior to visit.      No Known Allergies   Review of Systems General:Not Present- Fever, Weight Loss and Weight Gain. Skin:Not Present- Rash. HEENT:Not Present- Blurred Vision, Headache  and Bleeding Gums. Respiratory:Not Present- Difficulty Breathing. Breast:Not Present- Breast Mass. Cardiovascular:Not Present- Chest Pain, Elevated Blood Pressure, Fainting / Blacking Out and Shortness of Breath. Gastrointestinal:Not Present- Abdominal Pain, Constipation, Nausea and Vomiting. Female Genitourinary:Not Present- Frequency, Painful Urination, Pelvic Pain, Vaginal Bleeding, Vaginal Discharge, Contractions, regular, Fetal Movements Decreased, Urinary Complaints and Vaginal Fluid. Musculoskeletal:Not Present- Back Pain and Leg Cramps. Neurological:Not Present- Dizziness. Psychiatric:Not Present- Depression.     Objective:   Blood pressure 124/76, pulse 90, weight 156 lb 6.4 oz (70.9 kg), last menstrual period 01/06/2017.  Body mass index is 22.44 kg/m.  General Appearance:    Alert, cooperative, no distress, appears stated age  Head:    Normocephalic, without obvious abnormality, atraumatic  Eyes:    PERRL, conjunctiva/corneas clear, EOM's intact, both eyes  Ears:    Normal external ear canals, both  ears  Nose:   Nares normal, septum midline, mucosa normal, no drainage or sinus tenderness  Throat:   Lips, mucosa, and tongue normal; teeth and gums normal  Neck:   Supple, symmetrical, trachea midline, no adenopathy; thyroid: enlarged, goiter present.  No tenderness/nodules; no carotid bruit or JVD  Back:     Symmetric, no curvature, ROM normal, no CVA tenderness  Lungs:     Clear to auscultation bilaterally, respirations unlabored  Chest Wall:    No tenderness or deformity   Heart:    Regular rate and rhythm, S1 and S2 normal, no murmur, rub or gallop  Breast Exam:    No tenderness, masses, or nipple abnormality  Abdomen:     Soft, non-tender, bowel sounds active all four quadrants, no masses, no organomegaly.  FH 11.  FHT 160  bpm.  Genitalia:    Pelvic:external genitalia normal, vagina without lesions, discharge, or tenderness, rectovaginal septum  normal. Cervix normal in appearance, no cervical motion tenderness, no adnexal masses or tenderness.  Pregnancy positive findings: uterine enlargement: 11 wk size, nontender.   Rectal:    Normal external sphincter.  No hemorrhoids appreciated. Internal exam not done.   Extremities:   Extremities normal, atraumatic, no cyanosis or edema  Pulses:   2+ and symmetric all extremities  Skin:   Skin color, texture, turgor normal, no rashes or lesions  Lymph nodes:   Cervical, supraclavicular, and axillary nodes normal  Neurologic:   CNII-XII intact, normal strength, sensation and reflexes throughout       Assessment:   Pregnancy at 12 and 1/7 weeks   CIN II History of miscarriage H/o Beta thalassemia minor anemia Goiter   Plan:   Initial labs reviewed.  TSH ordered for h/o goiter. Prenatal vitamins encouraged. Problem list reviewed and updated. New OB counseling:  The patient has been given an overview regarding routine prenatal care.  Recommendations regarding diet, weight gain, and exercise in pregnancy were given. Prenatal testing,  optional genetic testing, and ultrasound use in pregnancy were reviewed.  1st trimester/2nd trimester screen and cell-free DNA testing discussed: patient desires cell free DNA testing with [id number].  Benefits of Breast Feeding were discussed. The patient is encouraged to consider nursing her baby post partum. CIN II, patient for f/u pap smears at 6 months and 12 months per ASCCP guidelines. Pap performed today.  Will continue to monitor for anemia during pregnancy, recent CBC wnl.  Follow up in 4 weeks.   50% of 30 min visit spent on counseling and coordination of care.

## 2017-04-02 ENCOUNTER — Encounter: Payer: Self-pay | Admitting: Obstetrics and Gynecology

## 2017-04-02 LAB — TSH

## 2017-04-06 LAB — MATERNIT21  PLUS CORE+ESS+SCA, BLOOD
Chromosome 13: NEGATIVE
Chromosome 18: NEGATIVE
Chromosome 21: NEGATIVE
Y Chromosome: NOT DETECTED

## 2017-04-08 ENCOUNTER — Other Ambulatory Visit: Payer: Self-pay | Admitting: Obstetrics and Gynecology

## 2017-04-08 ENCOUNTER — Encounter: Payer: Self-pay | Admitting: Obstetrics and Gynecology

## 2017-04-08 DIAGNOSIS — E049 Nontoxic goiter, unspecified: Secondary | ICD-10-CM

## 2017-04-08 DIAGNOSIS — R7989 Other specified abnormal findings of blood chemistry: Secondary | ICD-10-CM

## 2017-04-08 LAB — PAP IG AND HPV HIGH-RISK
HPV, high-risk: POSITIVE — AB
PAP SMEAR COMMENT: 0

## 2017-04-09 ENCOUNTER — Other Ambulatory Visit: Payer: Medicaid Other

## 2017-04-09 ENCOUNTER — Other Ambulatory Visit: Payer: Self-pay

## 2017-04-09 DIAGNOSIS — E049 Nontoxic goiter, unspecified: Secondary | ICD-10-CM

## 2017-04-09 DIAGNOSIS — R7989 Other specified abnormal findings of blood chemistry: Secondary | ICD-10-CM

## 2017-04-09 DIAGNOSIS — Z8639 Personal history of other endocrine, nutritional and metabolic disease: Secondary | ICD-10-CM

## 2017-04-10 LAB — THYROID PANEL WITH TSH
FREE THYROXINE INDEX: 2.5 (ref 1.2–4.9)
T3 UPTAKE RATIO: 19 % — AB (ref 24–39)
T4 TOTAL: 12.9 ug/dL — AB (ref 4.5–12.0)
TSH: 0.008 u[IU]/mL — ABNORMAL LOW (ref 0.450–4.500)

## 2017-04-14 ENCOUNTER — Telehealth: Payer: Self-pay

## 2017-04-14 NOTE — Telephone Encounter (Signed)
-----   Message from Hildred Laser, MD sent at 04/12/2017  6:11 PM EDT ----- Please inform of HGSIL pap smear (colposcopy during pregnancy is needed).  Also, patient with normal genetic screen, is a female if desires to know gender

## 2017-04-14 NOTE — Telephone Encounter (Signed)
Called pt informed her of the need for colpo, pt gave verbal understanding. Pt scheduled. Also informed pt of normal genetics. Desires to find out sex in envelope. Placed up front.

## 2017-04-29 ENCOUNTER — Encounter: Payer: Self-pay | Admitting: Obstetrics and Gynecology

## 2017-04-29 ENCOUNTER — Other Ambulatory Visit: Payer: Self-pay | Admitting: Obstetrics and Gynecology

## 2017-04-29 ENCOUNTER — Ambulatory Visit (INDEPENDENT_AMBULATORY_CARE_PROVIDER_SITE_OTHER): Payer: Medicaid Other | Admitting: Obstetrics and Gynecology

## 2017-04-29 VITALS — BP 98/62 | HR 89 | Wt 160.6 lb

## 2017-04-29 DIAGNOSIS — D563 Thalassemia minor: Secondary | ICD-10-CM

## 2017-04-29 DIAGNOSIS — N871 Moderate cervical dysplasia: Secondary | ICD-10-CM

## 2017-04-29 DIAGNOSIS — Z3482 Encounter for supervision of other normal pregnancy, second trimester: Secondary | ICD-10-CM

## 2017-04-29 LAB — POCT URINALYSIS DIPSTICK
BILIRUBIN UA: NEGATIVE
Glucose, UA: NEGATIVE
Ketones, UA: NEGATIVE
LEUKOCYTES UA: NEGATIVE
NITRITE UA: NEGATIVE
PH UA: 7 (ref 5.0–8.0)
PROTEIN UA: NEGATIVE
RBC UA: NEGATIVE
Spec Grav, UA: 1.01 (ref 1.010–1.025)
UROBILINOGEN UA: 0.2 U/dL

## 2017-04-29 NOTE — Progress Notes (Signed)
ROB:  Without complaint.  Taking prenatal vitamins.  Colpo already scheduled.   FAS scheduled at 20 weeks. Patient desires AFP today. Needs mid second trimester thyroid panel.

## 2017-05-02 LAB — AFP, SERUM, OPEN SPINA BIFIDA
AFP MOM: 0.66
AFP Value: 23.1 ng/mL
Gest. Age on Collection Date: 16.1 weeks
MATERNAL AGE AT EDD: 25.7 a
OSBR Risk 1 IN: 10000
TEST RESULTS AFP: NEGATIVE
Weight: 160 [lb_av]

## 2017-05-12 ENCOUNTER — Ambulatory Visit (INDEPENDENT_AMBULATORY_CARE_PROVIDER_SITE_OTHER): Payer: Medicaid Other | Admitting: Obstetrics and Gynecology

## 2017-05-12 VITALS — BP 100/64 | HR 101 | Ht 70.0 in | Wt 161.8 lb

## 2017-05-12 DIAGNOSIS — R87613 High grade squamous intraepithelial lesion on cytologic smear of cervix (HGSIL): Secondary | ICD-10-CM

## 2017-05-12 DIAGNOSIS — Z3A18 18 weeks gestation of pregnancy: Secondary | ICD-10-CM

## 2017-05-12 MED ORDER — CITRANATAL HARMONY 27-1-260 MG PO CAPS: 27.0000 mg | ORAL_CAPSULE | Freq: Every day | ORAL | 11 refills | Status: DC

## 2017-05-12 NOTE — Patient Instructions (Signed)
Cervical Biopsy  A cervical biopsy is a procedure to remove a small sample of tissue from the cervix. The cervix is the lowest part of the womb (uterus), which opens into the vagina (birth canal).  You may have this procedure to check for cancer or growths that may become cancer. This procedure may also be done if the results of your Pap test were abnormal or if your health care provider saw an abnormality during a pelvic exam.  Tell a health care provider about:   Any allergies you have.   All medicines you are taking, including vitamins, herbs, eye drops, creams, and over-the-counter medicines.   Any problems you or family members have had with anesthetic medicines.   Any blood disorders you have.   Any surgeries you have had.   Any medical conditions you have.   Whether you are pregnant or may be pregnant.   Whether you are having your menstrual period or will be having your period at the time of the procedure.  What are the risks?  Generally, this is a safe procedure. However, problems may occur, including:   Infection.   Bleeding.   Allergic reactions to medicines or dyes.   Damage to other structures or organs.    What happens before the procedure?   Do not douche, have sex, use tampons, or use any vaginal medicines before the procedure as told by your health care provider.   Follow instructions from your health care provider about eating or drinking restrictions.   Ask your health care provider about:  ? Changing or stopping your regular medicines. This is especially important if you are taking diabetes medicines or blood thinners.  ? Taking medicines such as aspirin and ibuprofen. These medicines can thin your blood. Do not take these medicines before your procedure if your health care provider instructs you not to.   You may be given an over-the-counter pain medicine to take right before the procedure.   You may be asked to empty your bladder and bowel right before the procedure.  What  happens during the procedure?   You will undress from the waist down.   You will lie on an examining table and put your feet in stirrups.   To reduce your risk of infection:  ? Your health care team will wash or sanitize their hands.  ? Your skin will be washed with soap.   Your health care provider will use a lubricated instrument (speculum) to open your vagina. An instrument that has a magnifying lens and a light (colposcope) will let your health care provider examine the cervix more closely.   You may be given a medicine to numb the area (local anesthetic).   Your health care provider will apply a solution to your cervix. This turns abnormal areas a pale color.   Your health care provider will use an instrument (biopsy forceps) to take one or more small pieces of tissue that appear abnormal.   If there seems to be an abnormal area in the part of your cervix that leads to the uterus (endocervical canal), your health care provider will use an instrument (curette) to scrape tissue from that area. This is called endocervical curettage.   Your health care provider will apply a paste over the biopsy areas to help control bleeding.  The procedure may vary among health care providers and hospitals.  What happens after the procedure?  It is your responsibility to get the results of your procedure. Ask your   health care provider or the department performing the procedure when your results will be ready.  This information is not intended to replace advice given to you by your health care provider. Make sure you discuss any questions you have with your health care provider.  Document Released: 08/04/2005 Document Revised: 12/13/2015 Document Reviewed: 12/20/2014  Elsevier Interactive Patient Education  2018 [employer]

## 2017-05-12 NOTE — Addendum Note (Signed)
Addended by: Frankey Shown on: 05/12/2017 03:42 PM   Modules accepted: Orders

## 2017-05-12 NOTE — Progress Notes (Signed)
    GYNECOLOGY CLINIC COLPOSCOPY PROCEDURE NOTE  26 y.o. [id number] here for colposcopy for high-grade squamous intraepithelial neoplasia  (HGSIL-encompassing moderate and severe dysplasia) pap smear on 04/01/2017.  She is also currently pregnant at [redacted]w[redacted]d today. Discussed role for HPV in cervical dysplasia, need for surveillance.  Patient given informed consent, signed copy in the chart, time out was performed.  Placed in lithotomy position. Cervix viewed with speculum and colposcope after application of acetic acid.   Colposcopy adequate? Yes  acetowhite lesion(s) noted at 3, 9, and 12 o'clock; corresponding biopsies obtained.  ECC specimen obtained (cytobrush only). All specimens were labeled and sent to pathology.   Patient was given post procedure instructions.  Will follow up pathology and manage accordingly; patient will be contacted with results and recommendations.  Routine preventative health maintenance measures emphasized.   Hildred Laser, MD [facility]

## 2017-05-12 NOTE — Addendum Note (Signed)
Addended by: Fabian November on: 05/12/2017 01:49 PM   Modules accepted: Level of Service

## 2017-05-14 LAB — PATHOLOGY

## 2017-05-26 ENCOUNTER — Encounter: Payer: Medicaid Other | Admitting: Obstetrics and Gynecology

## 2017-05-28 ENCOUNTER — Ambulatory Visit (INDEPENDENT_AMBULATORY_CARE_PROVIDER_SITE_OTHER): Payer: Medicaid Other | Admitting: Obstetrics and Gynecology

## 2017-05-28 ENCOUNTER — Encounter: Payer: Self-pay | Admitting: Obstetrics and Gynecology

## 2017-05-28 VITALS — BP 101/62 | HR 102 | Wt 164.6 lb

## 2017-05-28 DIAGNOSIS — Z8639 Personal history of other endocrine, nutritional and metabolic disease: Secondary | ICD-10-CM

## 2017-05-28 DIAGNOSIS — D069 Carcinoma in situ of cervix, unspecified: Secondary | ICD-10-CM

## 2017-05-28 DIAGNOSIS — Z3482 Encounter for supervision of other normal pregnancy, second trimester: Secondary | ICD-10-CM

## 2017-05-28 DIAGNOSIS — Z3402 Encounter for supervision of normal first pregnancy, second trimester: Secondary | ICD-10-CM

## 2017-05-28 LAB — POCT URINALYSIS DIPSTICK
BILIRUBIN UA: NEGATIVE
GLUCOSE UA: NEGATIVE
Ketones, UA: NEGATIVE
Leukocytes, UA: NEGATIVE
NITRITE UA: NEGATIVE
Protein, UA: NEGATIVE
RBC UA: NEGATIVE
Spec Grav, UA: 1.015 (ref 1.010–1.025)
Urobilinogen, UA: 4 E.U./dL — AB
pH, UA: 6.5 (ref 5.0–8.0)

## 2017-05-28 NOTE — Progress Notes (Signed)
ROB- Pt here today c/o some pelvic pressure at times when the baby moves, Declines flu vaccine

## 2017-05-28 NOTE — Progress Notes (Signed)
ROB: Doing well. Notes pelvic pressure with fetal movement. Declines flu vaccine. For anatomy scan, to be scheduled. Will repeat thyroid labs today.  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 RTC in 4 weeks.

## 2017-05-29 LAB — THYROID PANEL WITH TSH
Free Thyroxine Index: 2 (ref 1.2–4.9)
T3 UPTAKE RATIO: 16 % — AB (ref 24–39)
T4 TOTAL: 12.4 ug/dL — AB (ref 4.5–12.0)
TSH: 0.703 u[IU]/mL (ref 0.450–4.500)

## 2017-06-01 ENCOUNTER — Ambulatory Visit (INDEPENDENT_AMBULATORY_CARE_PROVIDER_SITE_OTHER): Payer: Medicaid Other

## 2017-06-01 DIAGNOSIS — Z3482 Encounter for supervision of other normal pregnancy, second trimester: Secondary | ICD-10-CM | POA: Diagnosis not present

## 2017-06-25 ENCOUNTER — Encounter: Payer: Self-pay | Admitting: Obstetrics and Gynecology

## 2017-06-25 ENCOUNTER — Ambulatory Visit (INDEPENDENT_AMBULATORY_CARE_PROVIDER_SITE_OTHER): Payer: Medicaid Other | Admitting: Obstetrics and Gynecology

## 2017-06-25 VITALS — BP 101/67 | HR 112 | Wt 169.2 lb

## 2017-06-25 DIAGNOSIS — Z3402 Encounter for supervision of normal first pregnancy, second trimester: Secondary | ICD-10-CM

## 2017-06-25 LAB — POCT URINALYSIS DIPSTICK
Bilirubin, UA: NEGATIVE
Blood, UA: NEGATIVE
Glucose, UA: NEGATIVE
KETONES UA: NEGATIVE
Leukocytes, UA: NEGATIVE
Nitrite, UA: NEGATIVE
PROTEIN UA: NEGATIVE
Urobilinogen, UA: 0.2 E.U./dL
pH, UA: 7 (ref 5.0–8.0)

## 2017-06-25 NOTE — Progress Notes (Signed)
ROB: Patient complains of some pelvic discomfort- round ligament pain.  We have discussed her colposcopically directed biopsies in great detail.  The significance of CIN 3 and the necessary follow-up postpartum were discussed in detail.  All her questions were answered.

## 2017-07-27 ENCOUNTER — Encounter: Payer: Medicaid Other | Admitting: Obstetrics and Gynecology

## 2017-07-29 ENCOUNTER — Ambulatory Visit (INDEPENDENT_AMBULATORY_CARE_PROVIDER_SITE_OTHER): Payer: Medicaid Other | Admitting: Obstetrics and Gynecology

## 2017-07-29 VITALS — BP 95/59 | HR 93 | Wt 171.7 lb

## 2017-07-29 DIAGNOSIS — E049 Nontoxic goiter, unspecified: Secondary | ICD-10-CM

## 2017-07-29 DIAGNOSIS — Z3403 Encounter for supervision of normal first pregnancy, third trimester: Secondary | ICD-10-CM

## 2017-07-29 DIAGNOSIS — E0590 Thyrotoxicosis, unspecified without thyrotoxic crisis or storm: Secondary | ICD-10-CM

## 2017-07-29 DIAGNOSIS — Z113 Encounter for screening for infections with a predominantly sexual mode of transmission: Secondary | ICD-10-CM

## 2017-07-29 DIAGNOSIS — Z131 Encounter for screening for diabetes mellitus: Secondary | ICD-10-CM

## 2017-07-29 DIAGNOSIS — Z130 Encounter for screening for diseases of the blood and blood-forming organs and certain disorders involving the immune mechanism: Secondary | ICD-10-CM

## 2017-07-29 LAB — POCT URINALYSIS DIPSTICK
Bilirubin, UA: NEGATIVE
Blood, UA: NEGATIVE
GLUCOSE UA: NEGATIVE
KETONES UA: NEGATIVE
Leukocytes, UA: NEGATIVE
NITRITE UA: NEGATIVE
Spec Grav, UA: 1.025 (ref 1.010–1.025)
Urobilinogen, UA: 0.2 E.U./dL
pH, UA: 7 (ref 5.0–8.0)

## 2017-07-29 NOTE — Progress Notes (Signed)
ROB: Doing well, no major complaints. Still noting some round ligament pain.  Declines Tdap today. To return in next few days for 1 hr glucola (could not complete today due to lateness of visit). Also will repeat thyroid labs for goiter and subclinical hypothyroidism. RTC in 2 weeks.

## 2017-07-29 NOTE — Progress Notes (Signed)
ROB- pt states she is doing ok, she does still have that pain in her pelvic, Declines tdap

## 2017-08-17 ENCOUNTER — Other Ambulatory Visit: Payer: Medicaid Other

## 2017-08-17 ENCOUNTER — Encounter: Payer: Medicaid Other | Admitting: Obstetrics and Gynecology

## 2017-08-18 NOTE — L&D Delivery Note (Addendum)
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  APGAR: 9, 9; weight.   Placenta status: expressed,3vi .  Cord:  with the following complications:none .  Cord pH: NA  Anesthesia:  none Episiotomy:  none Lacerations:  none Suture Repair: NA Est. Blood Loss (mL): 350   Mom to postpartum.  Baby to Couplet care / Skin to Skin.  Dorothy Young 10/01/2017, 4:35 PM

## 2017-08-19 ENCOUNTER — Other Ambulatory Visit: Payer: Medicaid Other

## 2017-08-19 ENCOUNTER — Ambulatory Visit (INDEPENDENT_AMBULATORY_CARE_PROVIDER_SITE_OTHER): Payer: Medicaid Other | Admitting: Obstetrics and Gynecology

## 2017-08-19 ENCOUNTER — Encounter: Payer: Self-pay | Admitting: Obstetrics and Gynecology

## 2017-08-19 ENCOUNTER — Other Ambulatory Visit: Payer: Self-pay

## 2017-08-19 VITALS — BP 90/55 | HR 102 | Wt 172.6 lb

## 2017-08-19 DIAGNOSIS — Z113 Encounter for screening for infections with a predominantly sexual mode of transmission: Secondary | ICD-10-CM

## 2017-08-19 DIAGNOSIS — Z3403 Encounter for supervision of normal first pregnancy, third trimester: Secondary | ICD-10-CM

## 2017-08-19 DIAGNOSIS — E049 Nontoxic goiter, unspecified: Secondary | ICD-10-CM

## 2017-08-19 DIAGNOSIS — Z131 Encounter for screening for diabetes mellitus: Secondary | ICD-10-CM

## 2017-08-19 DIAGNOSIS — Z130 Encounter for screening for diseases of the blood and blood-forming organs and certain disorders involving the immune mechanism: Secondary | ICD-10-CM

## 2017-08-19 DIAGNOSIS — E0590 Thyrotoxicosis, unspecified without thyrotoxic crisis or storm: Secondary | ICD-10-CM

## 2017-08-19 LAB — POCT URINALYSIS DIPSTICK
Bilirubin, UA: NEGATIVE
Blood, UA: NEGATIVE
Glucose, UA: NEGATIVE
KETONES UA: NEGATIVE
Leukocytes, UA: NEGATIVE
Nitrite, UA: NEGATIVE
Protein, UA: NEGATIVE
Spec Grav, UA: 1.01 (ref 1.010–1.025)
Urobilinogen, UA: 0.2 E.U./dL
pH, UA: 7 (ref 5.0–8.0)

## 2017-08-19 NOTE — Progress Notes (Signed)
ROB:  No complaints. 1 hr GCT today. 

## 2017-08-20 LAB — THYROID PANEL WITH TSH
FREE THYROXINE INDEX: 2 (ref 1.2–4.9)
T3 UPTAKE RATIO: 16 % — AB (ref 24–39)
T4, Total: 12.8 ug/dL — ABNORMAL HIGH (ref 4.5–12.0)
TSH: 0.342 u[IU]/mL — ABNORMAL LOW (ref 0.450–4.500)

## 2017-08-20 LAB — CBC
Hematocrit: 33.2 % — ABNORMAL LOW (ref 34.0–46.6)
Hemoglobin: 11 g/dL — ABNORMAL LOW (ref 11.1–15.9)
MCH: 26.1 pg — ABNORMAL LOW (ref 26.6–33.0)
MCHC: 33.1 g/dL (ref 31.5–35.7)
MCV: 79 fL (ref 79–97)
PLATELETS: 143 10*3/uL — AB (ref 150–379)
RBC: 4.22 x10E6/uL (ref 3.77–5.28)
RDW: 16.9 % — ABNORMAL HIGH (ref 12.3–15.4)
WBC: 6.2 10*3/uL (ref 3.4–10.8)

## 2017-08-20 LAB — GLUCOSE, 1 HOUR GESTATIONAL: Gestational Diabetes Screen: 82 mg/dL (ref 65–139)

## 2017-08-20 LAB — RPR: RPR: NONREACTIVE

## 2017-09-02 ENCOUNTER — Encounter: Payer: Self-pay | Admitting: Obstetrics and Gynecology

## 2017-09-02 ENCOUNTER — Ambulatory Visit (INDEPENDENT_AMBULATORY_CARE_PROVIDER_SITE_OTHER): Payer: Medicaid Other | Admitting: Obstetrics and Gynecology

## 2017-09-02 VITALS — BP 93/62 | HR 99 | Wt 176.7 lb

## 2017-09-02 DIAGNOSIS — Z3403 Encounter for supervision of normal first pregnancy, third trimester: Secondary | ICD-10-CM

## 2017-09-02 DIAGNOSIS — E0590 Thyrotoxicosis, unspecified without thyrotoxic crisis or storm: Secondary | ICD-10-CM | POA: Insufficient documentation

## 2017-09-02 LAB — POCT URINALYSIS DIPSTICK
Bilirubin, UA: NEGATIVE
Blood, UA: NEGATIVE
GLUCOSE UA: NEGATIVE
KETONES UA: NEGATIVE
Leukocytes, UA: NEGATIVE
Nitrite, UA: NEGATIVE
Protein, UA: NEGATIVE
Spec Grav, UA: 1.02 (ref 1.010–1.025)
Urobilinogen, UA: 0.2 E.U./dL
pH, UA: 7.5 (ref 5.0–8.0)

## 2017-09-02 NOTE — Progress Notes (Signed)
ROB: Doing well, no complaints. Will monitor growth. If FH still hasn't changed by next visit, may warrant growth ultrasound. RTC in 2 weeks.

## 2017-09-02 NOTE — Progress Notes (Signed)
ROB- Pt states she is doing well, no complaints  

## 2017-09-12 ENCOUNTER — Observation Stay
Admission: EM | Admit: 2017-09-12 | Discharge: 2017-09-12 | Disposition: A | Discharge status: Home / Self Care | Payer: Medicaid Other | Attending: Obstetrics and Gynecology | Admitting: Obstetrics and Gynecology

## 2017-09-12 DIAGNOSIS — Z3A36 36 weeks gestation of pregnancy: Secondary | ICD-10-CM | POA: Diagnosis not present

## 2017-09-12 DIAGNOSIS — O4703 False labor before 37 completed weeks of gestation, third trimester: Principal | ICD-10-CM

## 2017-09-12 DIAGNOSIS — Z3490 Encounter for supervision of normal pregnancy, unspecified, unspecified trimester: Secondary | ICD-10-CM

## 2017-09-12 NOTE — Discharge Summary (Signed)
Patient discharged with instructions on follow up appointment, labor precautions, possible SROM management, and when to seek medical attention. Patient ambulatory at discharge with steady gait and no complaints. Patient reports +FM at discharge. Patient by self home.

## 2017-09-12 NOTE — OB Triage Note (Signed)
Patient came in for observation for possible SROM. Patient states she started feeling clear leaking of fluid yesterday evening. Patient reports clear watery fluid. Patient reports uterine contractions that are not painful. Vital signs stable and patient afebrile. FHR baseline 155 with moderate variability with accelerations 15 x 15 and no decelerations. Patient in room by herself. Will continue to monitor.

## 2017-09-16 ENCOUNTER — Encounter: Payer: Self-pay | Admitting: Obstetrics and Gynecology

## 2017-09-16 ENCOUNTER — Ambulatory Visit (INDEPENDENT_AMBULATORY_CARE_PROVIDER_SITE_OTHER): Payer: Medicaid Other | Admitting: Obstetrics and Gynecology

## 2017-09-16 VITALS — BP 107/76 | HR 98 | Wt 175.6 lb

## 2017-09-16 DIAGNOSIS — Z3403 Encounter for supervision of normal first pregnancy, third trimester: Secondary | ICD-10-CM

## 2017-09-16 DIAGNOSIS — Z3493 Encounter for supervision of normal pregnancy, unspecified, third trimester: Secondary | ICD-10-CM

## 2017-09-16 NOTE — Progress Notes (Signed)
ROB- Patient feels well today with no complaints.  

## 2017-09-16 NOTE — Progress Notes (Signed)
ROB: No complaints.  Patient feels like she may be passing her mucous plug.  GC/CT -GBS performed.

## 2017-09-18 LAB — GC/CHLAMYDIA PROBE AMP
CHLAMYDIA, DNA PROBE: NEGATIVE
Neisseria gonorrhoeae by PCR: NEGATIVE

## 2017-09-18 LAB — STREP GP B NAA: Strep Gp B NAA: NEGATIVE

## 2017-09-21 NOTE — Final Progress Note (Signed)
L&D OB Triage Note  SUBJECTIVE Dorothy Young is a 27 y.o. [id number] female at 7133w6d, EDD Estimated Date of Delivery: 10/13/17 who presented to triage with complaints of leaking fluid.    OBJECTIVE Nursing Evaluation: BP 117/78 (BP Location: Left Arm)   Pulse 93   Temp 98.4 F (36.9 C) (Oral)   Resp 18   Ht 5\' 10"  (1.778 m)   Wt 176 lb (79.8 kg)   LMP 01/06/2017 (Exact Date)   BMI 25.25 kg/m  Nitrazine-NEGATIVE  NST was performed and has been reviewed by me.  NST INTERPRETATION: Indications: rule out uterine contractions  Mode: External Baseline Rate (A): 155 bpm Variability: Moderate Accelerations: 15 x 15 Decelerations: None     Contraction Frequency (min): 1-7  ASSESSMENT Impression:  1. Pregnancy:  [id number] at 5533w6d , EDD Estimated Date of Delivery: 10/13/17 2.  reactive NST 3.  Rupture of membrane assessment is negative  PLAN 1. Reassurance given 2. Discharge home with bleeding/labor precautions.  3. Continue routine prenatal care.   Herold HarmsMartin A Hridaan Bouse, MD

## 2017-09-23 ENCOUNTER — Ambulatory Visit (INDEPENDENT_AMBULATORY_CARE_PROVIDER_SITE_OTHER): Payer: Medicaid Other | Admitting: Obstetrics and Gynecology

## 2017-09-23 VITALS — BP 91/61 | HR 109 | Wt 177.6 lb

## 2017-09-23 DIAGNOSIS — Z3483 Encounter for supervision of other normal pregnancy, third trimester: Secondary | ICD-10-CM | POA: Diagnosis not present

## 2017-09-23 DIAGNOSIS — E0590 Thyrotoxicosis, unspecified without thyrotoxic crisis or storm: Secondary | ICD-10-CM

## 2017-09-23 LAB — POCT URINALYSIS DIPSTICK
BILIRUBIN UA: NEGATIVE
Blood, UA: NEGATIVE
Glucose, UA: NEGATIVE
KETONES UA: NEGATIVE
Leukocytes, UA: NEGATIVE
NITRITE UA: NEGATIVE
PH UA: 7 (ref 5.0–8.0)
PROTEIN UA: NEGATIVE
Spec Grav, UA: 1.01 (ref 1.010–1.025)
UROBILINOGEN UA: 0.2 U/dL

## 2017-09-23 NOTE — Progress Notes (Signed)
ROB: Noting increased pelvic pressure and sharp shooting pain in vagina.  Discussed labor precautions. 36 week labs negative.  RTC in 1 week.

## 2017-09-30 ENCOUNTER — Encounter: Payer: Medicaid Other | Admitting: Obstetrics and Gynecology

## 2017-09-30 ENCOUNTER — Encounter: Payer: Self-pay | Admitting: Obstetrics and Gynecology

## 2017-09-30 ENCOUNTER — Ambulatory Visit (INDEPENDENT_AMBULATORY_CARE_PROVIDER_SITE_OTHER): Payer: Medicaid Other | Admitting: Obstetrics and Gynecology

## 2017-09-30 VITALS — BP 100/66 | HR 106 | Wt 177.0 lb

## 2017-09-30 DIAGNOSIS — Z3483 Encounter for supervision of other normal pregnancy, third trimester: Secondary | ICD-10-CM

## 2017-09-30 DIAGNOSIS — E0590 Thyrotoxicosis, unspecified without thyrotoxic crisis or storm: Secondary | ICD-10-CM

## 2017-09-30 LAB — POCT URINALYSIS DIPSTICK
BILIRUBIN UA: NEGATIVE
Glucose, UA: NEGATIVE
KETONES UA: NEGATIVE
NITRITE UA: NEGATIVE
ODOR: NEGATIVE
Spec Grav, UA: 1.01 (ref 1.010–1.025)
Urobilinogen, UA: 0.2 E.U./dL
pH, UA: 7.5 (ref 5.0–8.0)

## 2017-09-30 NOTE — Progress Notes (Signed)
ROB-  SUBJECTIVE: No complaints.  Patient noted contractions all night.  Fetus is active.  No fluid leak. OBJECTIVE:BP 100/66   Pulse (!) 106   Wt 177 lb (80.3 kg)   LMP 01/06/2017 (Exact Date)   BMI 25.40 kg/m  Physical exam-no exophthalmos; no tremor; heart rate 106, no murmur, no S3 or S4; lungs clear ASSESSMENT: 1.  Maternal tachycardia with history of hyperthyroidism and abnormal TFTs; not currently on meds 2.  Advancing cervical dilation PLAN: 1.  Labor precautions 2.  TFTs 3.  Return in 1 week for follow-up Herold HarmsMartin A Ece Cumberland, MD

## 2017-09-30 NOTE — Patient Instructions (Signed)
1.  Thyroid function tests are ordered today 2.  Labor precautions are given 3.  Return in 1 week for regular OB visit 4.  Should contractions return or become stronger, go to labor delivery for labor check.

## 2017-10-01 ENCOUNTER — Inpatient Hospital Stay
Admission: EM | Admit: 2017-10-01 | Discharge: 2017-10-02 | DRG: 807 | Disposition: A | Discharge status: Home / Self Care | Payer: Medicaid Other | Attending: Obstetrics and Gynecology | Admitting: Obstetrics and Gynecology

## 2017-10-01 ENCOUNTER — Observation Stay (HOSPITAL_BASED_OUTPATIENT_CLINIC_OR_DEPARTMENT_OTHER)
Admission: EM | Admit: 2017-10-01 | Discharge: 2017-10-01 | Disposition: A | Discharge status: Home / Self Care | Payer: Medicaid Other | Source: Home / Self Care | Admitting: Obstetrics and Gynecology

## 2017-10-01 ENCOUNTER — Other Ambulatory Visit: Payer: Self-pay

## 2017-10-01 DIAGNOSIS — O4703 False labor before 37 completed weeks of gestation, third trimester: Secondary | ICD-10-CM

## 2017-10-01 DIAGNOSIS — D563 Thalassemia minor: Secondary | ICD-10-CM | POA: Diagnosis present

## 2017-10-01 DIAGNOSIS — Z3A38 38 weeks gestation of pregnancy: Secondary | ICD-10-CM | POA: Diagnosis not present

## 2017-10-01 DIAGNOSIS — D649 Anemia, unspecified: Secondary | ICD-10-CM | POA: Diagnosis present

## 2017-10-01 DIAGNOSIS — O479 False labor, unspecified: Secondary | ICD-10-CM

## 2017-10-01 DIAGNOSIS — Z3483 Encounter for supervision of other normal pregnancy, third trimester: Secondary | ICD-10-CM | POA: Diagnosis present

## 2017-10-01 DIAGNOSIS — O9902 Anemia complicating childbirth: Principal | ICD-10-CM | POA: Diagnosis present

## 2017-10-01 DIAGNOSIS — O4700 False labor before 37 completed weeks of gestation, unspecified trimester: Secondary | ICD-10-CM | POA: Diagnosis present

## 2017-10-01 LAB — CBC
HCT: 38.5 % (ref 35.0–47.0)
HEMOGLOBIN: 12.2 g/dL (ref 12.0–16.0)
MCH: 25.2 pg — ABNORMAL LOW (ref 26.0–34.0)
MCHC: 31.7 g/dL — AB (ref 32.0–36.0)
MCV: 79.5 fL — ABNORMAL LOW (ref 80.0–100.0)
Platelets: 142 10*3/uL — ABNORMAL LOW (ref 150–440)
RBC: 4.84 MIL/uL (ref 3.80–5.20)
RDW: 16 % — ABNORMAL HIGH (ref 11.5–14.5)
WBC: 16.1 10*3/uL — AB (ref 3.6–11.0)

## 2017-10-01 LAB — TYPE AND SCREEN
ABO/RH(D): O POS
ANTIBODY SCREEN: NEGATIVE

## 2017-10-01 LAB — THYROID PANEL WITH TSH
Free Thyroxine Index: 1.9 (ref 1.2–4.9)
T3 UPTAKE RATIO: 15 % — AB (ref 24–39)
T4 TOTAL: 12.9 ug/dL — AB (ref 4.5–12.0)
TSH: 0.886 u[IU]/mL (ref 0.450–4.500)

## 2017-10-01 MED ORDER — BENZOCAINE-MENTHOL 20-0.5 % EX AERO: 1.0000 "application " | INHALATION_SPRAY | CUTANEOUS | Status: DC | PRN

## 2017-10-01 MED ORDER — MISOPROSTOL 200 MCG PO TABS
ORAL_TABLET | ORAL | Status: AC
  Filled 2017-10-01: qty 4

## 2017-10-01 MED ORDER — PRENATAL MULTIVITAMIN CH
1.0000 | ORAL_TABLET | Freq: Every day | ORAL | Status: DC
  Administered 2017-10-02: 1 via ORAL
  Filled 2017-10-01: qty 1

## 2017-10-01 MED ORDER — MEASLES, MUMPS & RUBELLA VAC ~~LOC~~ INJ
0.5000 mL | INJECTION | Freq: Once | SUBCUTANEOUS | Status: DC
  Filled 2017-10-01: qty 0.5

## 2017-10-01 MED ORDER — SOD CITRATE-CITRIC ACID 500-334 MG/5ML PO SOLN: 30.0000 mL | ORAL | Status: DC | PRN

## 2017-10-01 MED ORDER — DOCUSATE SODIUM 100 MG PO CAPS
100.0000 mg | ORAL_CAPSULE | Freq: Two times a day (BID) | ORAL | Status: DC
  Administered 2017-10-01 – 2017-10-02 (×2): 100 mg via ORAL
  Filled 2017-10-01 (×2): qty 1

## 2017-10-01 MED ORDER — AMMONIA AROMATIC IN INHA
RESPIRATORY_TRACT | Status: AC
  Filled 2017-10-01: qty 10

## 2017-10-01 MED ORDER — DIBUCAINE 1 % RE OINT: 1.0000 "application " | TOPICAL_OINTMENT | RECTAL | Status: DC | PRN

## 2017-10-01 MED ORDER — FENTANYL CITRATE (PF) 100 MCG/2ML IJ SOLN: 50.0000 ug | INTRAMUSCULAR | Status: DC | PRN

## 2017-10-01 MED ORDER — LACTATED RINGERS IV SOLN
INTRAVENOUS | Status: DC
  Administered 2017-10-01: 16:00:00 via INTRAVENOUS

## 2017-10-01 MED ORDER — ACETAMINOPHEN 325 MG PO TABS: 650.0000 mg | ORAL_TABLET | ORAL | Status: DC | PRN

## 2017-10-01 MED ORDER — FERROUS SULFATE 325 (65 FE) MG PO TABS
325.0000 mg | ORAL_TABLET | Freq: Two times a day (BID) | ORAL | Status: DC
  Administered 2017-10-02: 325 mg via ORAL
  Filled 2017-10-01: qty 1

## 2017-10-01 MED ORDER — LACTATED RINGERS IV SOLN: 500.0000 mL | INTRAVENOUS | Status: DC | PRN

## 2017-10-01 MED ORDER — SIMETHICONE 80 MG PO CHEW: 80.0000 mg | CHEWABLE_TABLET | ORAL | Status: DC | PRN

## 2017-10-01 MED ORDER — IBUPROFEN 800 MG PO TABS
800.0000 mg | ORAL_TABLET | Freq: Three times a day (TID) | ORAL | Status: DC
  Administered 2017-10-01 – 2017-10-02 (×3): 800 mg via ORAL
  Filled 2017-10-01 (×3): qty 1

## 2017-10-01 MED ORDER — OXYCODONE-ACETAMINOPHEN 5-325 MG PO TABS
2.0000 | ORAL_TABLET | ORAL | Status: DC | PRN
  Administered 2017-10-01 – 2017-10-02 (×2): 2 via ORAL
  Filled 2017-10-01 (×2): qty 2

## 2017-10-01 MED ORDER — ONDANSETRON HCL 4 MG PO TABS: 4.0000 mg | ORAL_TABLET | ORAL | Status: DC | PRN

## 2017-10-01 MED ORDER — LIDOCAINE HCL (PF) 1 % IJ SOLN: 30.0000 mL | INTRAMUSCULAR | Status: DC | PRN

## 2017-10-01 MED ORDER — DIPHENHYDRAMINE HCL 25 MG PO CAPS: 25.0000 mg | ORAL_CAPSULE | Freq: Four times a day (QID) | ORAL | Status: DC | PRN

## 2017-10-01 MED ORDER — OXYTOCIN BOLUS FROM INFUSION
500.0000 mL | Freq: Once | INTRAVENOUS | Status: AC
  Administered 2017-10-01: 500 mL via INTRAVENOUS

## 2017-10-01 MED ORDER — WITCH HAZEL-GLYCERIN EX PADS: 1.0000 "application " | MEDICATED_PAD | CUTANEOUS | Status: DC | PRN

## 2017-10-01 MED ORDER — ONDANSETRON HCL 4 MG/2ML IJ SOLN: 4.0000 mg | INTRAMUSCULAR | Status: DC | PRN

## 2017-10-01 MED ORDER — CALCIUM CARBONATE ANTACID 500 MG PO CHEW: 2.0000 | CHEWABLE_TABLET | ORAL | Status: DC | PRN

## 2017-10-01 MED ORDER — OXYTOCIN 10 UNIT/ML IJ SOLN
INTRAMUSCULAR | Status: AC
  Filled 2017-10-01: qty 2

## 2017-10-01 MED ORDER — ONDANSETRON HCL 4 MG/2ML IJ SOLN: 4.0000 mg | Freq: Four times a day (QID) | INTRAMUSCULAR | Status: DC | PRN

## 2017-10-01 MED ORDER — LIDOCAINE HCL (PF) 1 % IJ SOLN
INTRAMUSCULAR | Status: AC
  Filled 2017-10-01: qty 30

## 2017-10-01 MED ORDER — OXYCODONE-ACETAMINOPHEN 5-325 MG PO TABS: 1.0000 | ORAL_TABLET | ORAL | Status: DC | PRN

## 2017-10-01 MED ORDER — COCONUT OIL OIL: 1.0000 "application " | TOPICAL_OIL | Status: DC | PRN

## 2017-10-01 MED ORDER — TETANUS-DIPHTH-ACELL PERTUSSIS 5-2.5-18.5 LF-MCG/0.5 IM SUSP: 0.5000 mL | INTRAMUSCULAR | Status: DC | PRN

## 2017-10-01 MED ORDER — OXYTOCIN 40 UNITS IN LACTATED RINGERS INFUSION - SIMPLE MED
2.5000 [IU]/h | INTRAVENOUS | Status: DC
  Administered 2017-10-01: 2.5 [IU]/h via INTRAVENOUS

## 2017-10-01 MED ORDER — OXYTOCIN 40 UNITS IN LACTATED RINGERS INFUSION - SIMPLE MED
INTRAVENOUS | Status: AC
  Filled 2017-10-01: qty 1000

## 2017-10-01 NOTE — Progress Notes (Signed)
Provided and reviewed discharge paperwork and reviewed reasons to return to hospital to include leaking of fluid or ROM, increase in contraction pain/frequency, decreased fetal movement. Pt and significant other verbalized understanding. Discharged pt ambulatory to go home with significant other.

## 2017-10-01 NOTE — H&P (Signed)
Dorothy Young is a 27 y.o. female presenting for labor. Intermittent contractions off and on over 36hours.  Good fetal movement.  No preeclampsia symptoms.  No vaginal bleeding.  No rupture of membranes.   History OB History    Gravida Para Term Preterm AB Living   6 3 3   2 3    SAB TAB Ectopic Multiple Live Births   1     0 3     Past Medical History:  Diagnosis Date  . Anemia    thalassemia Beta minor  . H/O chlamydia infection 2011  . Pregnancy   . Thalassemia, beta (HCC) 05/18/2014  . Worsening headaches    Past Surgical History:  Procedure Laterality Date  . NO PAST SURGERIES     Family History: family history includes Cancer in her paternal aunt; Diabetes in her paternal grandmother. Social History:  reports that  has never smoked. she has never used smokeless tobacco. She reports that she does not drink alcohol or use drugs.  Review of Systems  Constitutional: Negative.   HENT: Negative.   Eyes: Negative.   Cardiovascular: Negative.   Gastrointestinal: Negative.   Genitourinary:       Strong contractions  Musculoskeletal: Negative.   Skin: Negative.   Neurological: Negative.   Psychiatric/Behavioral: Negative.     Dilation: Lip/rim Effacement (%): 100 Station: +2 Exam by:: Defrancesco MD Temperature 98.5 F (36.9 C), temperature source Oral, resp. rate 16, height 5\' 11"  (1.803 m), weight 177 lb (80.3 kg), last menstrual period 01/06/2017. Maternal Exam:  Uterine Assessment: Contraction strength is firm.  Contraction frequency is regular.   Abdomen: Patient reports no abdominal tenderness. Fetal presentation: vertex  Introitus: Normal vulva. Normal vagina.  Ferning test: not done.  Nitrazine test: not done.  Pelvis: adequate for delivery.   Cervix: Cervix evaluated by digital exam.     Physical Exam  Constitutional: She appears well-developed and well-nourished.  HENT:  Head: Atraumatic.  Eyes: Conjunctivae are normal.  Neck: Neck supple.   Cardiovascular: Normal rate and regular rhythm.  Respiratory: Effort normal.    Prenatal labs: ABO, Rh: O/Positive/-- (08/03 1512) Antibody: Negative (08/03 1512) Rubella: 2.49 (08/03 1512) RPR: Non Reactive (01/02 1148)  HBsAg: Negative (08/03 1512)  HIV: Non Reactive (08/03 1512)  GBS: Negative (01/30 0940)   Assessment/Plan: 1.  38.2-week IUP in active labor. 2.  Subclinical hyperthyroidism 3.  Beta thalassemia minor 4.  History of chlamydia, treated 5.  GBS negative   Anticipate SVD  Prentice DockerMartin A Defrancesco 10/01/2017, 4:41 PM

## 2017-10-01 NOTE — OB Triage Note (Signed)
Pt was seen in triage last night for contractions. Pt states her contractions started yesterday at 18:20 and has progressively gotten worst since then. Pt states she has had some vaginal bleeding that appeared a "darker red" color. Pt states she has had some leakage of fluid. Pt rates her pain 10/10. Pt reports positive fetal movement.

## 2017-10-01 NOTE — Discharge Summary (Signed)
L&D OB Triage Note  SUBJECTIVE Dorothy Young is a 27 y.o. [id number] female at 3920w2d, EDD Estimated Date of Delivery: 10/13/17 who presented to triage with complaints of contractions.  Good fetal movement.  No vaginal bleeding or rupture membranes. Patient was seen in the office earlier in the day and was noted to be 4 cm dilated   OBJECTIVE Nursing Evaluation: BP 122/70 (BP Location: Right Arm)   Pulse 88   Temp 98.3 F (36.8 C) (Oral)   Resp 18   LMP 01/06/2017 (Exact Date)  no significant findings for labor-cervix unchanged  NST was performed and has been reviewed by me.  NST INTERPRETATION: Indications: rule out uterine contractions  Mode: External Baseline Rate (A): 140 bpm(FHT) Variability: Moderate Accelerations: 15 x 15 Decelerations: None     Contraction Frequency (min): 4-8  ASSESSMENT Impression:  1. Pregnancy:  [id number] at 8420w2d , EDD Estimated Date of Delivery: 10/13/17 2.  reactive NST 3.  No cervical change  PLAN 1. Reassurance given 2. Discharge home with bleeding/labor precautions.  3. Continue routine prenatal care.   Dorothy HarmsMartin A Deward Sebek, MD

## 2017-10-01 NOTE — OB Triage Note (Addendum)
G6/P3, 3344w2d here reporting contraction pain to lower abd and back rated 9/10 intermittently, "every 15 minutes for 2 hours." Reports positive fetal movement. Denies vaginal discharge or bleeding at this time. VS stable. Monitors applied/assessing.

## 2017-10-02 LAB — CBC
HCT: 35.1 % (ref 35.0–47.0)
HEMOGLOBIN: 11.5 g/dL — AB (ref 12.0–16.0)
MCH: 25.7 pg — AB (ref 26.0–34.0)
MCHC: 32.7 g/dL (ref 32.0–36.0)
MCV: 78.7 fL — AB (ref 80.0–100.0)
Platelets: 140 10*3/uL — ABNORMAL LOW (ref 150–440)
RBC: 4.46 MIL/uL (ref 3.80–5.20)
RDW: 16 % — ABNORMAL HIGH (ref 11.5–14.5)
WBC: 12.6 10*3/uL — AB (ref 3.6–11.0)

## 2017-10-02 MED ORDER — IBUPROFEN 800 MG PO TABS: 800.0000 mg | ORAL_TABLET | Freq: Three times a day (TID) | ORAL | 0 refills | Status: DC

## 2017-10-02 MED ORDER — ACETAMINOPHEN 325 MG PO TABS: 650.0000 mg | ORAL_TABLET | ORAL | 1 refills | Status: DC | PRN

## 2017-10-02 NOTE — Discharge Summary (Signed)
Physician Obstetric Discharge Summary  Patient ID: Evert Kohlvec Monshea Ferdinand MRN: [id number] DOB/AGE: 1991/04/02 27 y.o.   Date of Admission: 10/01/2017  Date of Discharge:   Admitting Diagnosis: Labor at 38.[redacted] weeks gestation at 7247w2d  Secondary Diagnosis: Anemia in pregnancy and Beta thalassemia minor; history of CIN-3 (needs colpo); subclinical hyperthyroidism  Mode of Delivery: normal spontaneous vaginal delivery  Discharge Diagnosis: Status post SVD; viable female 354 grams; maternal anemia; beta thalassemia minor; history of CIN 3 (needs colpo); subclinical hyperthyroidism   Intrapartum Procedures: Atificial rupture of membranes   Post partum procedures: none  Complications: none   Brief Hospital Course  Kennidee Ivery QualeMonshea Rufener is a [id number] who had a SVD on 10/01/2017;  for further details of this surgery, please refer to the delivey note.  Patient had an uncomplicated postpartum course.  Patient is pumping/nursing. She had appropriate lochia and was ambulating, voiding without difficulty, tolerating regular diet and passing flatus.   She was deemed stable for discharge to home.    Labs: CBC Latest Ref Rng & Units 10/02/2017 10/01/2017 08/19/2017  WBC 3.6 - 11.0 K/uL 12.6(H) 16.1(H) 6.2  Hemoglobin 12.0 - 16.0 g/dL 11.5(L) 12.2 11.0(L)  Hematocrit 35.0 - 47.0 % 35.1 38.5 33.2(L)  Platelets 150 - 440 K/uL 140(L) 142(L) 143(L)   O POS  Physical exam:  Blood pressure (!) 100/57, pulse 80, temperature 98.1 F (36.7 C), temperature source Oral, resp. rate 12, height 5\' 11"  (1.803 m), weight 177 lb (80.3 kg), last menstrual period 01/06/2017, SpO2 100 %, unknown if currently breastfeeding. General: alert and no distress Lochia: appropriate Abdomen: soft, NT Uterine Fundus: firm Extremities: No evidence of DVT seen on physical exam. No lower extremity edema.  Discharge Instructions: Per After Visit Summary. Activity: Advance as tolerated. Pelvic rest for 6 weeks.  Also refer to After  Visit Summary Diet: Regular Medications: Allergies as of 10/02/2017   No Known Allergies     Medication List    TAKE these medications   acetaminophen 325 MG tablet Commonly known as:  TYLENOL Take 2 tablets (650 mg total) by mouth every 4 (four) hours as needed (for pain scale < 4ORtemperature>/=100.5 F).   CITRANATAL HARMONY 27-1-260 MG Caps Take 27 mg by mouth daily.   ibuprofen 800 MG tablet Commonly known as:  ADVIL,MOTRIN Take 1 tablet (800 mg total) by mouth 3 (three) times daily.      Outpatient follow up:  Follow-up Information    Defrancesco, Prentice DockerMartin A, MD. Schedule an appointment as soon as possible for a visit in 6 week(s).   Specialties:  Obstetrics and Gynecology, Radiology Why:  Post Partum Contact information: [address] [place] [address] [phone]          Postpartum contraception: condoms  Discharged Condition: good  Discharged to: home   Newborn Data: Disposition:home with mother  Apgars: APGAR (1 MIN): 9   APGAR (5 MINS): 9   APGAR (10 MINS):    Baby Feeding: Breast  Herold HarmsMartin A Defrancesco, MD

## 2017-10-02 NOTE — Discharge Instructions (Signed)
Please call your doctor or return to the ER if you experience any chest pains, shortness of breath, dizziness, visual changes, fever greater than 101, any heavy bleeding (saturating more than 1 pad per hour), large clots, or foul smelling discharge, any worsening abdominal pain and cramping that is not controlled by pain medication, or any signs of postpartum depression. No tampons, enemas, douches, or sexual intercourse for 6 weeks. Also avoid tub baths, hot tubs, or swimming for 6 weeks.  °

## 2017-10-02 NOTE — Progress Notes (Signed)
Discharge order received from doctor. Reviewed discharge instructions and prescriptions with patient and answered all questions. Follow up appointment instructions given. Patient verbalized understanding. ID bands checked. Patient discharged home with infant via wheelchair by nursing/auxillary.    Oval Cavazos Garner, RN  

## 2017-10-07 ENCOUNTER — Encounter: Payer: Medicaid Other | Admitting: Obstetrics and Gynecology

## 2017-11-11 ENCOUNTER — Ambulatory Visit (INDEPENDENT_AMBULATORY_CARE_PROVIDER_SITE_OTHER): Payer: Medicaid Other | Admitting: Obstetrics and Gynecology

## 2017-11-11 ENCOUNTER — Encounter: Payer: Self-pay | Admitting: Obstetrics and Gynecology

## 2017-11-11 DIAGNOSIS — E049 Nontoxic goiter, unspecified: Secondary | ICD-10-CM

## 2017-11-11 DIAGNOSIS — D069 Carcinoma in situ of cervix, unspecified: Secondary | ICD-10-CM

## 2017-11-11 DIAGNOSIS — E0590 Thyrotoxicosis, unspecified without thyrotoxic crisis or storm: Secondary | ICD-10-CM

## 2017-11-11 NOTE — Progress Notes (Signed)
Chief complaint: 1.  6-week postpartum check 2.  Status post spontaneous vaginal delivery  27 year old female para [id number], status post spontaneous vaginal delivery (rapid birth in observation room) of a viable female infant having Apgars of 9 and 9. Prenatal course was notable for beta thalassemia minor, history of cervical dysplasia, subclinical hypothyroidism, and history of chlamydia which was treated.  No anesthesia was utilized.  No episiotomy was performed.  No lacerations were encountered.  Blood loss was 350 mL.  Patient was breast-feeding.  She has not had menses yet. Patient has had intercourse without problem.  Patient reports normal bowel bladder function.  Postpartum depression screening questionnaire is negative for postpartum blues or depression. PHQ-9 score is 1  OBJECTIVE: BP 115/77   Pulse 76   Ht 5\' 11"  (1.803 m)   Wt 159 lb (72.1 kg)   Breastfeeding? No   BMI 22.18 kg/m   Pleasant well-appearing female in no acute distress.  Alert and oriented. HEENT exam: No exophthalmos; no nystagmus; no lid lag Neck: Palpable symmetric thyroid; no adenopathy Lungs: Clear Breasts: No mass adenopathy or nipple discharge. Heart: Regular rate and rhythm without murmur Abdomen: Soft, nontender without organomegaly Pelvic exam: External genitalia-normal BUS-normal Vagina-normal Cervix-parous; no lesions; no cervical motion tenderness Uterus-top normal size, midplane, mobile, nontender Adnexa-nonpalpable nontender Rectovaginal-normal external exam  ASSESSMENT: 1.  6-week postpartum check-normal 2.  Enlarged thyroid; history of subclinical hyperthyroidism, asymptomatic today 3.  Status post spontaneous vaginal delivery 4.  History of cervical dysplasia, severe, needs colposcopy  PLAN: 1.  Resume activities as tolerated 2.  Return in 3 months for colposcopy with Dr. Valentino Saxonherry or myself-patient preference 3.  Contraception-condoms for now; patient contemplating alternative  birth control methods 4.  Continue prenatal vitamins  Herold HarmsMartin A Dannell Raczkowski, MD  Note: This dictation was prepared with Dragon dictation along with smaller phrase technology. Any transcriptional errors that result from this process are unintentional.

## 2017-11-11 NOTE — Patient Instructions (Signed)
1.  Resume all activities without restriction 2.  Thyroid function panel is obtained today to assess thyroid status 3.  Continue with prenatal vitamins 4.  Condoms for contraception at this time until more definitive contraception is decided upon 5.  Return in 3 months for colposcopy appointment due to history of CIN-3.  Appointment may be made with myself or Dr. Valentino Saxonherry (patient's long-term provider)

## 2017-11-12 LAB — THYROID PANEL WITH TSH
Free Thyroxine Index: 2.1 (ref 1.2–4.9)
T3 UPTAKE RATIO: 27 % (ref 24–39)
T4, Total: 7.6 ug/dL (ref 4.5–12.0)
TSH: 1.02 u[IU]/mL (ref 0.450–4.500)

## 2017-12-16 IMAGING — US US OB TRANSVAGINAL
1 series · 14 of 24 positions shown · non-contrast
Comparison: Ultrasound dated 06/05/2016

CLINICAL DATA: 25-year-old female with vaginal bleeding. Patient
unsure of her LMP but approximately 03/11/2016.

EXAM:
TRANSVAGINAL OB ULTRASOUND
TECHNIQUE: Transvaginal ultrasound was performed for complete evaluation of the
gestation as well as the maternal uterus, adnexal regions, and
pelvic cul-de-sac.

[Series 1: us ob transvaginal · 24 acquisitions, 14 frames shown]
[im 1/24]
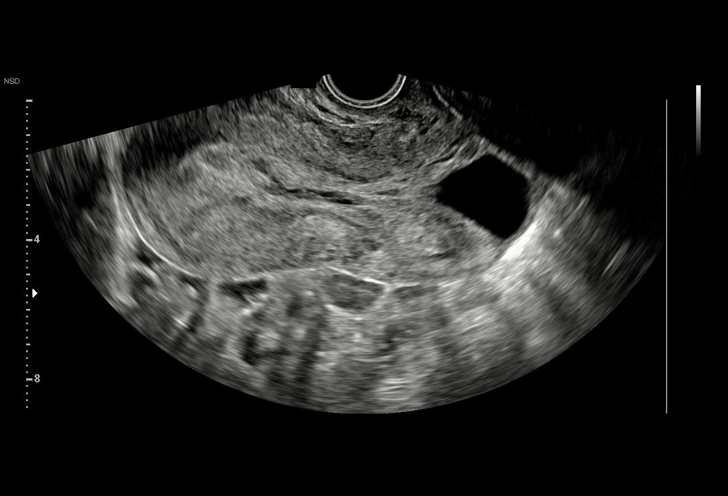
[im 3/24]
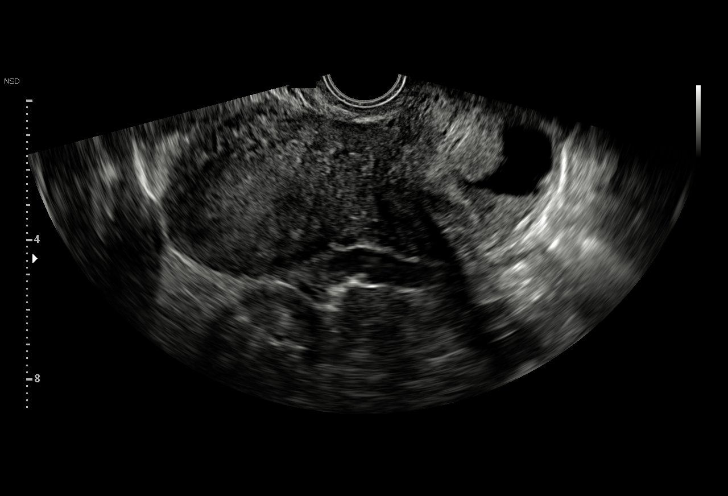
[im 5/24]
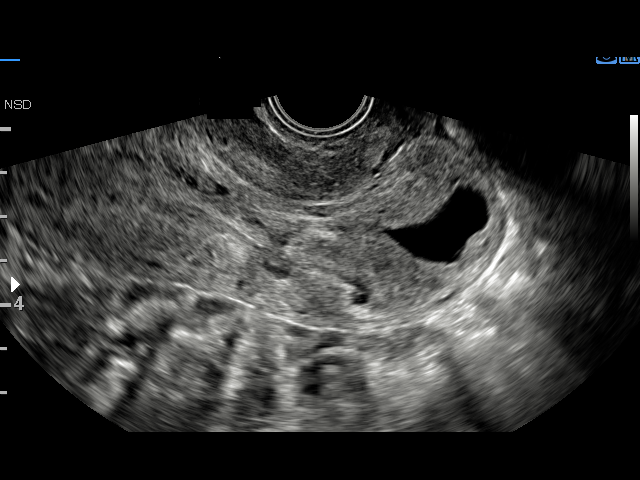
[im 7/24]
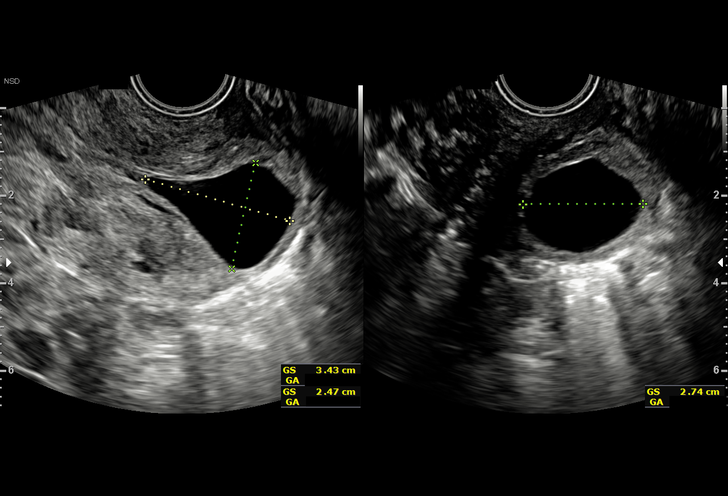
[im 8/24]
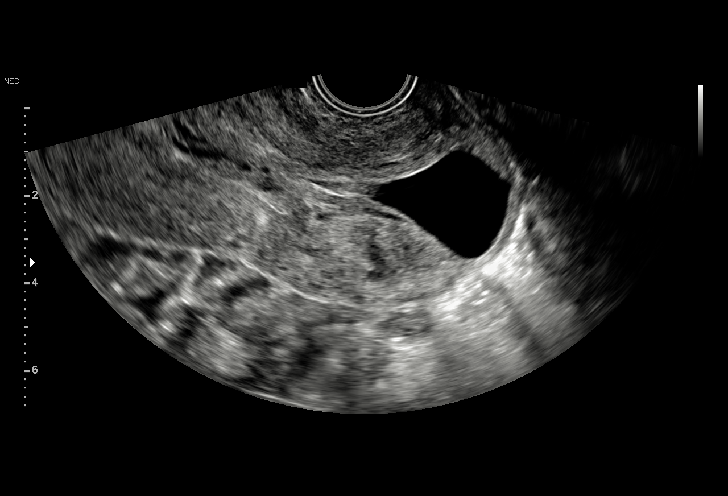
[im 10/24]
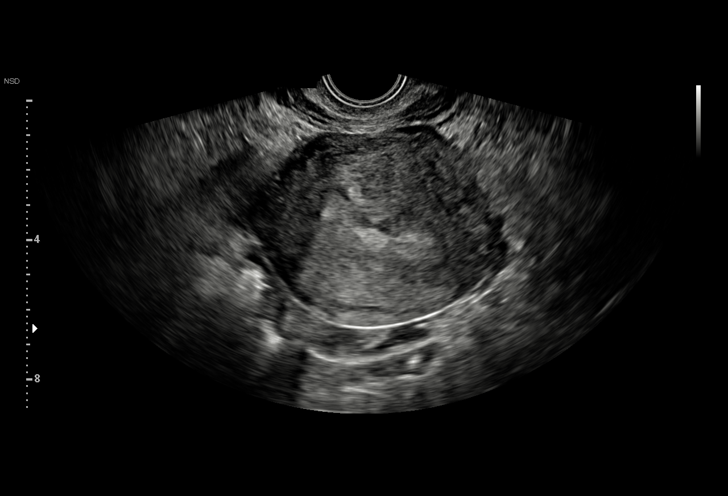
[im 12/24]
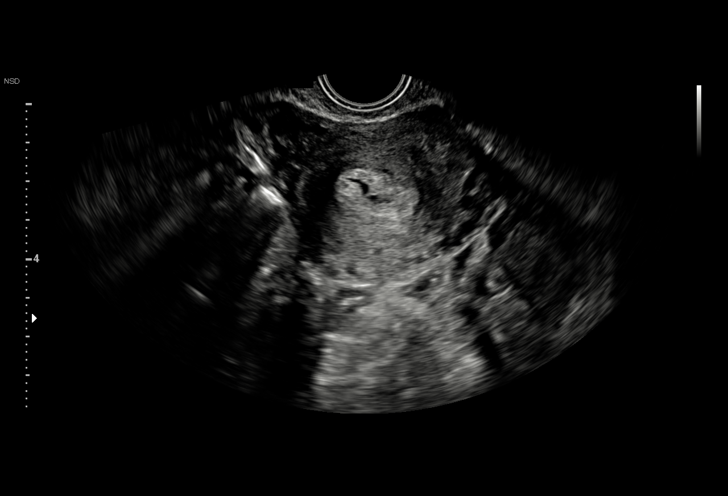
[im 13/24]
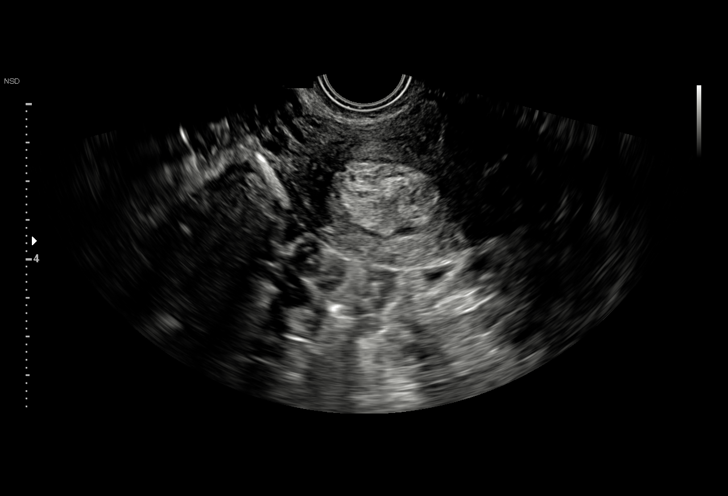
[im 15/24]
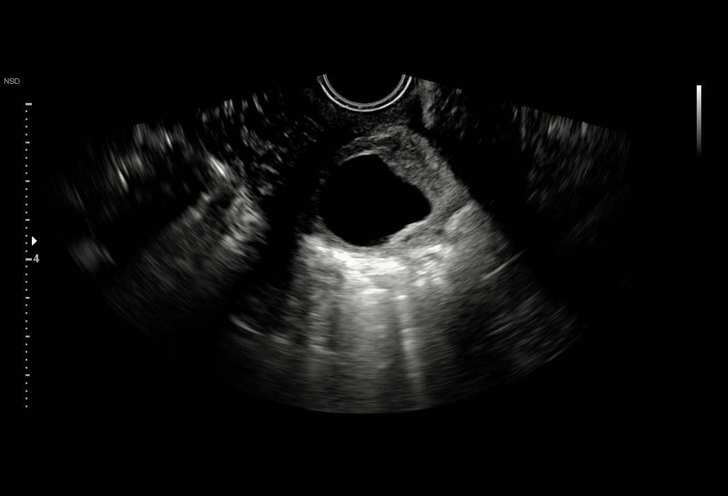
[im 17/24]
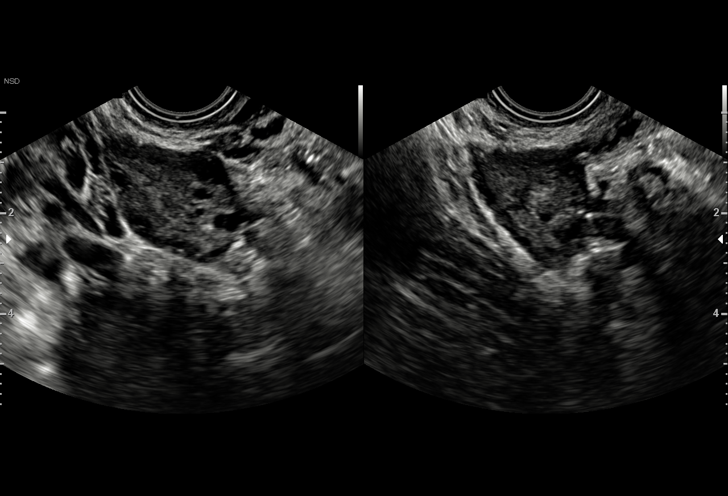
[im 19/24]
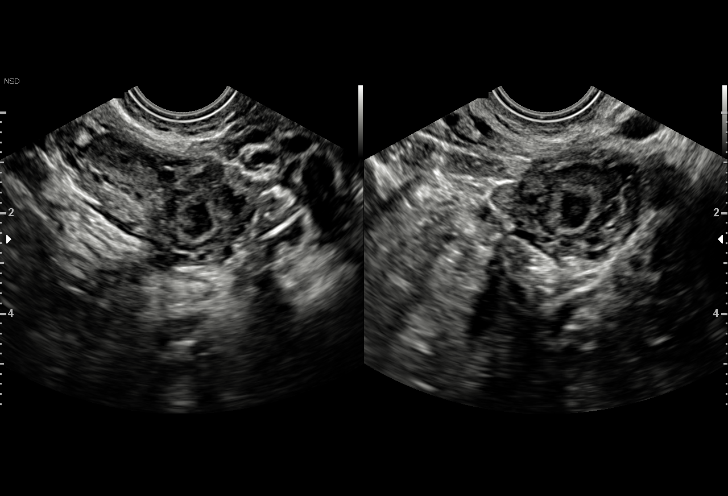
[im 20/24]
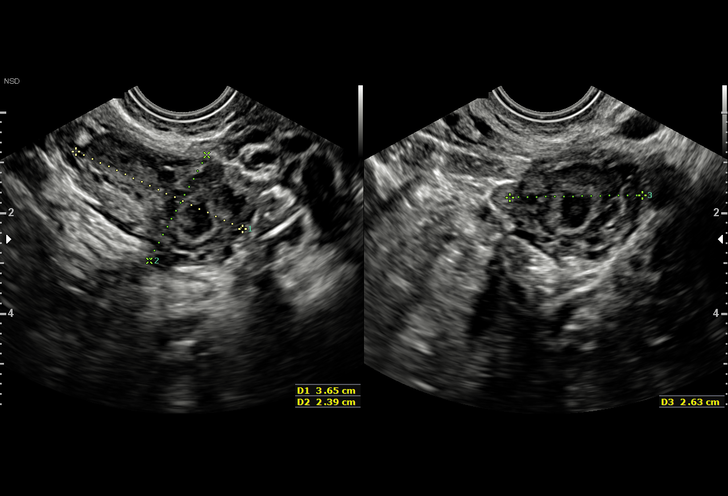
[im 22/24]
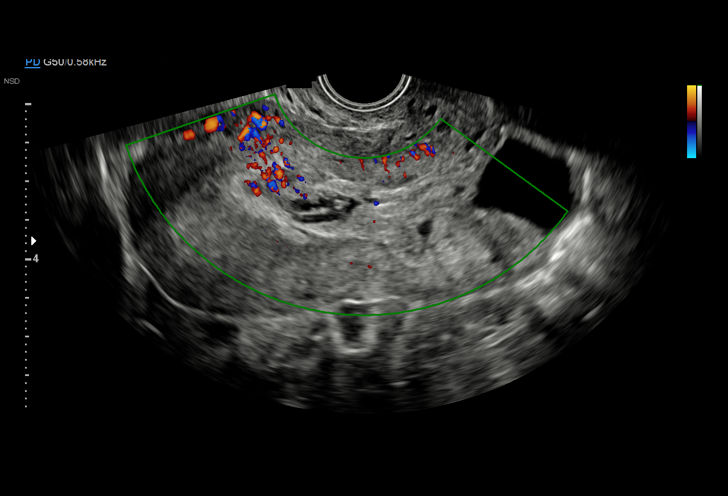
[im 24/24]
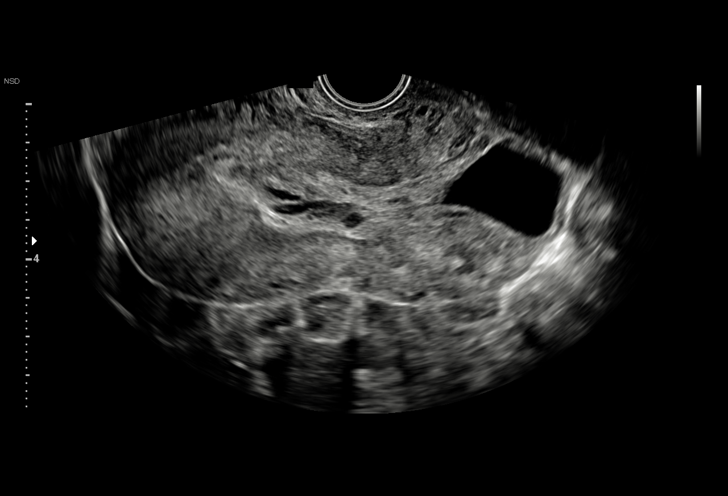

[14 of 24 positions shown; findings below may reference images not displayed]

FINDINGS: A cystic structure is noted in the endocervical canal and upper
vagina which may represent an abnormal and enlarged gestational sac.
No fetal pole or yolk sac identified within this cystic structure.
If this cystic structure is a true gestational sac the estimated
gestational age based on mean sac diameter of 2.9 cm is 7 weeks, 6
days.

Please note in the absence of documented true IUP by ultrasound, the
possibility of an ectopic pregnancy is not entirely excluded.
Correlation with clinical exam and follow-up with serial HCG levels
and ultrasound recommended.

The endometrium appears thickened with heterogeneous content which
may represent small amount of blood. Flow is noted within the upper
endometrium.

Maternal uterus/adnexae: The uterus is anteverted and appears
unremarkable. The right ovary appears unremarkable and measures
x 2.2 x 2.2 cm. There is a 1.5 x 1.5 cm heterogeneous structure with
central cystic/sac-like component in the region of the left ovary.
This structure was seen on the prior ultrasound appears within the
ovary and less likely next to the ovary. Although this most likely
represents a corpus luteum, an ectopic pregnancy, although less
likely, is not entirely excluded. Correlation with clinical exam and
obstetrical consult is advised.
IMPRESSION: Previously seen intrauterine cystic structure is now partially
within the endocervical canal and upper vagina compatible with
miscarriage in progress. Clinical correlation and follow-up
recommended. Echogenic and heterogeneous content within the
endometrium may represent blood clot/debris.

Complex area within the left ovary appears similar to prior
ultrasound and most likely represents a corpus luteum. An ectopic
pregnancy is less likely. Correlation with clinical exam and
follow-up with serial HCG levels and ultrasound recommended.

These results were called by telephone at the time of interpretation
on 06/09/2016 at [DATE] to Dr. IDALIE PAPILI , who verbally
acknowledged these results.

## 2018-01-17 ENCOUNTER — Encounter: Payer: Self-pay | Admitting: Obstetrics and Gynecology

## 2018-02-04 ENCOUNTER — Encounter: Payer: Medicaid Other | Admitting: Obstetrics and Gynecology

## 2018-03-03 ENCOUNTER — Encounter: Payer: Medicaid Other | Admitting: Obstetrics and Gynecology

## 2018-08-20 ENCOUNTER — Emergency Department: Payer: Self-pay

## 2018-08-20 ENCOUNTER — Other Ambulatory Visit: Payer: Self-pay

## 2018-08-20 DIAGNOSIS — Z79899 Other long term (current) drug therapy: Secondary | ICD-10-CM | POA: Insufficient documentation

## 2018-08-20 DIAGNOSIS — R51 Headache: Secondary | ICD-10-CM | POA: Insufficient documentation

## 2018-08-20 LAB — COMPREHENSIVE METABOLIC PANEL
ALT: 16 U/L (ref 0–44)
AST: 15 U/L (ref 15–41)
Albumin: 4.8 g/dL (ref 3.5–5.0)
Alkaline Phosphatase: 52 U/L (ref 38–126)
Anion gap: 10 (ref 5–15)
BUN: 8 mg/dL (ref 6–20)
CO2: 21 mmol/L — ABNORMAL LOW (ref 22–32)
Calcium: 9.4 mg/dL (ref 8.9–10.3)
Chloride: 105 mmol/L (ref 98–111)
Creatinine, Ser: 0.59 mg/dL (ref 0.44–1.00)
GFR calc Af Amer: >60 mL/min (ref >60)
GFR calc non Af Amer: >60 mL/min (ref >60)
Glucose, Bld: 92 mg/dL (ref 70–99)
Potassium: 3.3 mmol/L — ABNORMAL LOW (ref 3.5–5.1)
Sodium: 136 mmol/L (ref 135–145)
Total Bilirubin: 0.9 mg/dL (ref 0.3–1.2)
Total Protein: 8.3 g/dL — ABNORMAL HIGH (ref 6.5–8.1)

## 2018-08-20 LAB — CBC
HCT: 41.5 % (ref 36.0–46.0)
Hemoglobin: 13.1 g/dL (ref 12.0–15.0)
MCH: 25.4 pg — ABNORMAL LOW (ref 26.0–34.0)
MCHC: 31.6 g/dL (ref 30.0–36.0)
MCV: 80.4 fL (ref 80.0–100.0)
Platelets: 171 10*3/uL (ref 150–400)
RBC: 5.16 MIL/uL — ABNORMAL HIGH (ref 3.87–5.11)
RDW: 15.4 % (ref 11.5–15.5)
WBC: 5.7 10*3/uL (ref 4.0–10.5)
nRBC: 0 % (ref 0.0–0.2)

## 2018-08-20 LAB — TROPONIN I: Troponin I: <0.03 ng/mL (ref <0.03)

## 2018-08-20 NOTE — ED Triage Notes (Signed)
Pt in with co headache states hx of the same but had episode of shob and chest pain. States feels generalized weakness, denies any recent illness.

## 2018-08-21 ENCOUNTER — Emergency Department: Payer: Self-pay

## 2018-08-21 ENCOUNTER — Emergency Department
Admission: EM | Admit: 2018-08-21 | Discharge: 2018-08-21 | Disposition: A | Discharge status: Home / Self Care | Payer: Self-pay | Attending: Emergency Medicine | Admitting: Emergency Medicine

## 2018-08-21 DIAGNOSIS — R51 Headache: Secondary | ICD-10-CM

## 2018-08-21 DIAGNOSIS — R519 Headache, unspecified: Secondary | ICD-10-CM

## 2018-08-21 MED ORDER — ACETAMINOPHEN 500 MG PO TABS
1000.0000 mg | ORAL_TABLET | Freq: Once | ORAL | Status: AC
  Administered 2018-08-21: 1000 mg via ORAL
  Filled 2018-08-21: qty 2

## 2018-08-21 MED ORDER — DEXAMETHASONE SODIUM PHOSPHATE 10 MG/ML IJ SOLN
10.0000 mg | Freq: Once | INTRAMUSCULAR | Status: AC
  Administered 2018-08-21: 10 mg via INTRAVENOUS
  Filled 2018-08-21: qty 1

## 2018-08-21 MED ORDER — KETOROLAC TROMETHAMINE 30 MG/ML IJ SOLN
15.0000 mg | Freq: Once | INTRAMUSCULAR | Status: AC
  Administered 2018-08-21: 15 mg via INTRAVENOUS
  Filled 2018-08-21: qty 1

## 2018-08-21 MED ORDER — DIPHENHYDRAMINE HCL 50 MG/ML IJ SOLN
12.5000 mg | INTRAMUSCULAR | Status: AC
  Administered 2018-08-21: 12.5 mg via INTRAVENOUS
  Filled 2018-08-21: qty 1

## 2018-08-21 MED ORDER — SODIUM CHLORIDE 0.9 % IV BOLUS
500.0000 mL | Freq: Once | INTRAVENOUS | Status: AC
  Administered 2018-08-21: 500 mL via INTRAVENOUS

## 2018-08-21 MED ORDER — METOCLOPRAMIDE HCL 5 MG/ML IJ SOLN
10.0000 mg | INTRAMUSCULAR | Status: AC
  Administered 2018-08-21: 10 mg via INTRAVENOUS
  Filled 2018-08-21: qty 2

## 2018-08-21 NOTE — ED Provider Notes (Signed)
[facility] Emergency Department Provider Note  ____________________________________________   First MD Initiated Contact with Patient 08/21/18 0205     (approximate)  I have reviewed the triage vital signs and the nursing notes.   HISTORY  Chief Complaint Headache and Chest Pain    HPI Dorothy Young is a 28 y.o. female with medical history as listed below who presents for evaluation of severe left-sided sharp and throbbing headache that is accompanied with light sensitivity that has been gradual in onset and waxing waning throughout the course of the day.  She reports a history of increasingly worsening headaches over about the last month and according the medical record this is been documented previously as well.  She called EMS and came in by paramedics tonight because of an acute episode of the headache accompanied with some shortness of breath and chest pain.  She has not had symptoms like this in the past and it did resolve but it took "quite a while" for it to get better.  The chest symptoms have resolved but she still has the headache.  She says she feels tired all over but has no focal weakness nor numbness in any of her extremities.  She does not take estrogen supplements, no recent trips or surgeries, no recent travel, no recent leg pain or swelling.  No history of blood clots in the legs of the lungs.  Nothing in particular made her symptoms better or worse.   Past Medical History:  Diagnosis Date  . Anemia    thalassemia Beta minor  . H/O chlamydia infection 2011  . Pregnancy   . Thalassemia, beta (HCC) 05/18/2014  . Worsening headaches     Patient Active Problem List   Diagnosis Date Noted  . Premature uterine contractions, antepartum, third trimester 10/01/2017  . Subclinical hyperthyroidism 09/02/2017  . CIN III (cervical intraepithelial neoplasia grade III) with severe dysplasia 03/04/2017  . H/O miscarriage, currently pregnant,  first trimester 03/04/2017  . History of chlamydia 06/05/2016  . History of abnormal cervical Pap smear 06/05/2016  . Goiter 06/05/2016  . Beta thalassemia minor 05/18/2014    Past Surgical History:  Procedure Laterality Date  . NO PAST SURGERIES      Prior to Admission medications   Medication Sig Start Date End Date Taking? Authorizing Provider  Prenat-FeFmCb-DSS-FA-DHA w/o A (CITRANATAL HARMONY) 27-1-260 MG CAPS Take 27 mg by mouth daily. 05/12/17   Hildred Laser, MD    Allergies Patient has no known allergies.  Family History  Problem Relation Age of Onset  . Diabetes Paternal Grandmother   . Cancer Paternal Aunt        unknown cancer    Social History Social History   Tobacco Use  . Smoking status: Never Smoker  . Smokeless tobacco: Never Used  Substance Use Topics  . Alcohol use: No  . Drug use: No    Review of Systems Constitutional: No fever/chills Eyes: Photophobia associated with headache ENT: No sore throat. Cardiovascular: Chest pain earlier tonight as described above Respiratory: Shortness of breath earlier tonight as described above Gastrointestinal: No abdominal pain.  No nausea, no vomiting.  No diarrhea.  No constipation. Genitourinary: Negative for dysuria. Musculoskeletal: Negative for neck pain.  Negative for back pain. Integumentary: Negative for rash. Neurological: Left-sided headache as described above with some mild photophobia.   ____________________________________________   PHYSICAL EXAM:  VITAL SIGNS: ED Triage Vitals [08/20/18 2120]  Enc Vitals Group     BP (!) 136/99  Pulse Rate (!) 112     Resp 20     Temp 98.7 F (37.1 C)     Temp Source Oral     SpO2 100 %     Weight 70.3 kg (155 lb)     Height 1.778 m (5\' 10" )     Head Circumference      Peak Flow      Pain Score 8     Pain Loc      Pain Edu?      Excl. in GC?     Constitutional: Alert and oriented. Well appearing and in no acute distress. Eyes:  Conjunctivae are normal. PERRL. EOMI. Head: Atraumatic. Nose: No congestion/rhinnorhea. Mouth/Throat: Mucous membranes are moist. Neck: No stridor.  No meningeal signs.   Cardiovascular: Mild tachycardia triage after arrival by EMS, then the tachycardia resolved, regular rhythm. Good peripheral circulation. Grossly normal heart sounds. Respiratory: Normal respiratory effort.  No retractions. Lungs CTAB. Gastrointestinal: Soft and nontender. No distention.  Musculoskeletal: No lower extremity tenderness nor edema. No gross deformities of extremities. Neurologic:  Normal speech and language. No gross focal neurologic deficits are appreciated.  Skin:  Skin is warm, dry and intact. No rash noted. Psychiatric: Mood and affect are normal. Speech and behavior are normal.  ____________________________________________   LABS (all labs ordered are listed, but only abnormal results are displayed)  Labs Reviewed  CBC - Abnormal; Notable for the following components:      Result Value   RBC 5.16 (*)    MCH 25.4 (*)    All other components within normal limits  COMPREHENSIVE METABOLIC PANEL - Abnormal; Notable for the following components:   Potassium 3.3 (*)    CO2 21 (*)    Total Protein 8.3 (*)    All other components within normal limits  TROPONIN I   ____________________________________________  EKG  ED ECG REPORT I, Loleta Roseory Reiley Keisler, the attending physician, personally viewed and interpreted this ECG.  Date: 08/20/2018 EKG Time: 21: 26 Rate: 97 Rhythm: normal sinus rhythm with sinus arrhythmia QRS Axis: normal Intervals: normal ST/T Wave abnormalities: normal Narrative Interpretation: no evidence of acute ischemia  ____________________________________________  RADIOLOGY I, Loleta Roseory Psalm Schappell, personally viewed and evaluated these images (plain radiographs) as part of my medical decision making, as well as reviewing the written report by the radiologist.  ED MD interpretation: No  acute abnormality on chest x-ray  Official radiology report(s): Dg Chest 2 View  Result Date: 08/20/2018 CLINICAL DATA:  Short of breath and chest pain EXAM: CHEST - 2 VIEW COMPARISON:  None. FINDINGS: The heart size and mediastinal contours are within normal limits. Both lungs are clear. The visualized skeletal structures are unremarkable. IMPRESSION: No active cardiopulmonary disease. Electronically Signed   By: Jasmine PangKim  Fujinaga M.D.   On: 08/20/2018 21:42   Ct Head Wo Contrast  Result Date: 08/21/2018 CLINICAL DATA:  Initial evaluation for acute severe headache. EXAM: CT HEAD WITHOUT CONTRAST TECHNIQUE: Contiguous axial images were obtained from the base of the skull through the vertex without intravenous contrast. COMPARISON:  None available. FINDINGS: Brain: Cerebral volume within normal limits for patient age. No evidence for acute intracranial hemorrhage. No findings to suggest acute large vessel territory infarct. No mass lesion, midline shift, or mass effect. Ventricles are normal in size without evidence for hydrocephalus. No extra-axial fluid collection identified. Vascular: No hyperdense vessel identified. Skull: Scalp soft tissues demonstrate no acute abnormality. Calvarium intact. Sinuses/Orbits: Globes and orbital soft tissues within normal limits. Mild scattered mucosal  thickening within the ethmoidal air cells. Visualized paranasal sinuses are otherwise clear. No mastoid effusion. Middle ear cavities are clear. IMPRESSION: Normal head CT.  No acute intracranial abnormality identified. Electronically Signed   By: Rise Mu M.D.   On: 08/21/2018 03:52    ____________________________________________   PROCEDURES  Critical Care performed: No   Procedure(s) performed:   Procedures   ____________________________________________   INITIAL IMPRESSION / ASSESSMENT AND PLAN / ED COURSE  As part of my medical decision making, I reviewed the following data within the  electronic MEDICAL RECORD NUMBER Nursing notes reviewed and incorporated, Labs reviewed , EKG interpreted , Old chart reviewed and Notes from prior ED visits    Differential diagnosis includes, but is not limited to, migraines, tension headache, stress headache, PE, ACS, viral illness.  The patient has no other signs or symptoms of viral illness.  She was tachycardic upon arrival but the tachycardia has completely resolved and given that she has no persistent tachycardia, she is PERC negative with no risk factors and a well score for PE of 0.  I think the most likely she is having migraine headaches and then had an acute nonspecific episode earlier tonight for which she has recovered.  Chest x-ray is normal, EKG is normal, lab work is within normal limits other than a slightly decreased potassium which is noncontributory.  After discussing various treatment options with the patient, we agreed upon treating for migraine with the following combination: Normal saline 500 mL IV bolus, Reglan 10 mg IV, Benadryl 12.5 mg IV, Decadron 10 mg IV, Toradol 15 mg IV, Tylenol 1000 mg p.o.  I am also checking head CT because of her reported worsening headaches although she is neurologically intact with no evidence of any focal neurological deficits.  I will reassess after the CT scan and medical treatment.  Clinical Course as of Aug 21 445  Sat Aug 21, 2018  0403 Normal head CT per radiology  CT Head Wo Contrast [CF]  [phone] The patient states she feels much better and her headache is gone.  I updated her about her results and she agrees with the plan for discharge and outpatient follow-up.  I gave my usual and customary return precautions.   [CF]    Clinical Course User Index [CF] Loleta Rose, MD    ____________________________________________  FINAL CLINICAL IMPRESSION(S) / ED DIAGNOSES  Final diagnoses:  Acute nonintractable headache, unspecified headache type     MEDICATIONS GIVEN DURING THIS  VISIT:  Medications  sodium chloride 0.9 % bolus 500 mL (500 mLs Intravenous New Bag/Given 08/21/18 0316)  metoCLOPramide (REGLAN) injection 10 mg (10 mg Intravenous Given 08/21/18 0316)  diphenhydrAMINE (BENADRYL) injection 12.5 mg (12.5 mg Intravenous Given 08/21/18 0315)  ketorolac (TORADOL) 30 MG/ML injection 15 mg (15 mg Intravenous Given 08/21/18 0316)  dexamethasone (DECADRON) injection 10 mg (10 mg Intravenous Given 08/21/18 0315)  acetaminophen (TYLENOL) tablet 1,000 mg (1,000 mg Oral Given 08/21/18 0315)     ED Discharge Orders    None       Note:  This document was prepared using Dragon voice recognition software and may include unintentional dictation errors.    Loleta Rose, MD 08/21/18 [phone]

## 2018-08-21 NOTE — Discharge Instructions (Addendum)

## 2019-04-05 ENCOUNTER — Encounter: Payer: Self-pay | Admitting: Obstetrics and Gynecology

## 2019-04-22 ENCOUNTER — Other Ambulatory Visit (HOSPITAL_COMMUNITY)
Admission: RE | Admit: 2019-04-22 | Discharge: 2019-04-22 | Disposition: A | Discharge status: Home / Self Care | Payer: Medicaid Other | Source: Ambulatory Visit | Attending: Obstetrics and Gynecology | Admitting: Obstetrics and Gynecology

## 2019-04-22 ENCOUNTER — Other Ambulatory Visit: Payer: Self-pay

## 2019-04-22 ENCOUNTER — Ambulatory Visit (INDEPENDENT_AMBULATORY_CARE_PROVIDER_SITE_OTHER): Payer: Medicaid Other | Admitting: Obstetrics and Gynecology

## 2019-04-22 ENCOUNTER — Encounter: Payer: Self-pay | Admitting: Obstetrics and Gynecology

## 2019-04-22 VITALS — BP 124/83 | HR 93 | Ht 70.0 in | Wt 166.0 lb

## 2019-04-22 DIAGNOSIS — R87613 High grade squamous intraepithelial lesion on cytologic smear of cervix (HGSIL): Secondary | ICD-10-CM | POA: Diagnosis not present

## 2019-04-22 NOTE — Progress Notes (Signed)
Pt is present for colpo pt stated that she was doing well no problems.

## 2019-04-22 NOTE — Addendum Note (Signed)
Addended by: Edwyna Shell on: 04/22/2019 11:53 AM   Modules accepted: Orders

## 2019-04-22 NOTE — Progress Notes (Signed)
    GYNECOLOGY CLINIC COLPOSCOPY PROCEDURE NOTE  28 y.o. [id number] here for colposcopy for high-grade squamous intraepithelial neoplasia  (HGSIL-encompassing moderate and severe dysplasia) pap smear on 04/01/2017 performed during her last pregnancy.   Patient notes that after her last pregnancy she did not return for the colposcopy. Discussed role for HPV in cervical dysplasia, need for surveillance.  Patient given informed consent, signed copy in the chart, time out was performed.  Placed in lithotomy position. Cervix viewed with speculum and colposcope after application of acetic acid.   Colposcopy adequate? Yes  no visible lesions, no mosaicism, no punctation and acetowhite lesion(s) noted at 2 and 5-6 o'clock; HPV changes noted at 12 o'clock.  Corresponding biopsies obtained.  ECC specimen obtained. All specimens were labeled and sent to pathology.  Pap smear also collected today.   Patient was given post procedure instructions.  Will follow up pathology and manage accordingly; patient will be contacted with results and recommendations.  Routine preventative health maintenance measures emphasized.    Rubie Maid, MD [facility]

## 2019-04-22 NOTE — Patient Instructions (Signed)

## 2019-04-27 LAB — CYTOLOGY - PAP

## 2019-05-06 ENCOUNTER — Ambulatory Visit (INDEPENDENT_AMBULATORY_CARE_PROVIDER_SITE_OTHER): Payer: Medicaid Other | Admitting: Obstetrics and Gynecology

## 2019-05-06 ENCOUNTER — Encounter: Payer: Self-pay | Admitting: Obstetrics and Gynecology

## 2019-05-06 ENCOUNTER — Other Ambulatory Visit: Payer: Self-pay

## 2019-05-06 VITALS — BP 111/79 | HR 90 | Ht 70.0 in | Wt 166.2 lb

## 2019-05-06 DIAGNOSIS — D069 Carcinoma in situ of cervix, unspecified: Secondary | ICD-10-CM | POA: Diagnosis not present

## 2019-05-06 NOTE — Patient Instructions (Signed)
Preventing Cervical Cancer  Cervical cancer is cancer that grows on the cervix. The cervix is at the bottom of the uterus. It connects the uterus to the vagina. The uterus is where a baby develops during pregnancy. Cancer occurs when cells become abnormal and start to grow out of control. Cervical cancer grows slowly and may not cause any symptoms at first. Over time, the cancer can grow deep into the cervix tissue and spread to other areas. If it is found early, cervical cancer can be treated effectively. You can also take steps to prevent this type of cancer. Most cases of cervical cancer are caused by an STI (sexually transmitted infection) called human papillomavirus (HPV). One way to reduce your risk of cervical cancer is to avoid infection with the HPV virus. You can do this by practicing safe sex and by getting the HPV vaccine. Getting regular Pap tests is also important because this can help identify changes in cells that could lead to cancer. Your chances of getting this disease can also be reduced by making certain lifestyle changes. How can I protect myself from cervical cancer? Preventing HPV infection  Ask your health care provider about getting the HPV vaccine. If you are 26 years old or younger, you may need to get this vaccine, which is given in three doses over 6 months. This vaccine protects against the types of HPV that could cause cancer.  Limit the number of people you have sex with. Also avoid having sex with people who have had many sex partners.  Use a latex condom during sex. Getting Pap tests  Get Pap tests regularly, starting at age 21. Talk with your health care provider about how often you need these tests. ? Most women who are 21?27 years of age should have a Pap test every 3 years. ? Most women who are 30?27 years of age should have a Pap test in combination with an HPV test every 5 years. ? Women with a higher risk of cervical cancer, such as those with a weakened  immune system or those who have been exposed to the drug diethylstilbestrol (DES), may need more frequent testing. Making other lifestyle changes  Do not use any products that contain nicotine or tobacco, such as cigarettes and e-cigarettes. If you need help quitting, ask your health care provider.  Eat at least 5 servings of fruits and vegetables every day.  Lose weight if you are overweight. Why are these changes important?  These changes and screening tests are designed to address the factors that are known to increase the risk of cervical cancer. Taking these steps is the best way to reduce your risk.  Having regular Pap tests will help identify changes in cells that could lead to cancer. Steps can then be taken to prevent cancer from developing.  These changes will also help find cervical cancer early. This type of cancer can be treated effectively if it is found early. It can be more dangerous and difficult to treat if cancer has grown deep into your cervix or has spread.  In addition to making you less likely to get cervical cancer, these changes will also provide other health benefits, such as the following: ? Practicing safe sex is important for preventing STIs and unplanned pregnancies. ? Avoiding tobacco can reduce your risk for other cancers and health issues. ? Eating a healthy diet and maintaining a healthy weight are good for your overall health. What can happen if changes are not made? In   the early stages, cervical cancer might not have any symptoms. It can take many years for the cancer to grow and get deep into the cervix tissue. This may be happening without you knowing about it. If you develop any symptoms, such as pelvic pain or unusual discharge or bleeding from your vagina, you should see your health care provider right away. If cervical cancer is not found early, you might need treatments such as radiation, chemotherapy, or surgery. In some cases, surgery may mean that  you will not be able to get pregnant or carry a pregnancy to term. Where to find support Talk with your health care provider, school nurse, or local health department for guidance about screening and vaccination. Some children and teens may be able to get the HPV vaccine free of charge through the U.S. government's Vaccines for Children (VFC) program. Other places that provide vaccinations include:  Public health clinics. Check with your local health department.  Federally Qualified Health Centers, where you would pay only what you can afford. To find one near you, check this website: www.fqhc.org/find-an-fqhc/  [facility]. These are part of a program for Medicare and Medicaid patients who live in rural areas. The National Breast and Cervical Cancer Early Detection Program also provides breast and cervical cancer screenings and diagnostic services to low-income, uninsured, and underinsured women. Cervical cancer can be passed down through families. Talk with your health care provider or genetic counselor to learn more about genetic testing for cancer. Where to find more information Learn more about cervical cancer from:  American College of Gynecology: www.acog.org/Patients/FAQs/Cervical-Cancer  American Cancer Society: www.cancer.org/cancer/cervicalcancer/  U.S. Centers for Disease Control and Prevention: www.cdc.gov/cancer/cervical/ Summary  Talk with your health care provider about getting the HPV vaccine.  Be sure to get regular Pap tests as recommended by your health care provider.  See your health care provider right away if you have any pelvic pain or unusual discharge or bleeding from your vagina. This information is not intended to replace advice given to you by your health care provider. Make sure you discuss any questions you have with your health care provider. Document Released: 08/19/2015 Document Revised: 09/05/2017 Document Reviewed: 04/01/2016 Elsevier Patient  Education  2020 Elsevier Inc.  

## 2019-05-06 NOTE — Progress Notes (Signed)
Pt is present today for a discuss for a LEEP procedure. Pt stated that she was doing well no problems.

## 2019-05-06 NOTE — Progress Notes (Signed)
    GYNECOLOGY PROGRESS NOTE  Subjective:    Patient ID: Dorothy Young, female    DOB: [birth year], 28 y.o.   MRN: [id number]  HPI  Patient is a 28 y.o. [id number] female who presents for discussion of need for LEEP procedure.  Pathology is as follows:   04/01/2017: HGSIL pap smear. 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  04/22/2019: LGSIL, with few cells suggestive of a higher grade lesion. Colposcopy done with CIN I at 12 and 5 o'clock, CIN III at 2 o'clock, ECC with CIN I.    The following portions of the patient's history were reviewed and updated as appropriate: allergies, current medications, past family history, past medical history, past social history, past surgical history and problem list.  Review of Systems Pertinent items noted in HPI and remainder of comprehensive ROS otherwise negative.   Objective:   Blood pressure 111/79, pulse 90, height 5\' 10"  (1.778 m), weight 166 lb 3.2 oz (75.4 kg), last menstrual period 04/13/2019, not currently breastfeeding. General appearance: alert and no distress Cardiovascular: normal rate, regular rhythm, normal S1, S2, no murmurs, rubs, clicks or gallops. Lungs:  Normal respiratory effort, chest expands symmetrically. Lungs are clear to auscultation, no crackles or wheezes. Remainder of exam deferred.   Assessment:   CIN III  Plan:   Discussion had with patient regarding results that based on severity of dysplasia, patient needs a LEEP/cone procedure by ASCCP guidelines.  Discussed nature of procedure, as well as risks and benefits.  Offered patient the choice of having LEEP performed in office, or can have it performed in the OR.  Patient desires to have OR procedure.  Will schedule for 05/30/2019. Pre-op done today.    A total of 15 minutes were spent face-to-face with the patient during this encounter and over half of that time dealt with counseling and coordination of care.   Rubie Maid, MD  [facility]

## 2019-05-06 NOTE — H&P (Signed)
GYNECOLOGY PREOPERATIVE HISTORY AND PHYSICAL   Subjective:  Dorothy Young is a 28 y.o. [id number] here for surgical management of severe cervical dysplasia (CIN III). No significant preoperative concerns.   Proposed surgery: Loop Electrode Excision Procedure (LEEP)   Pertinent Gynecological History: Menses: flow is moderate, regular every month without intermenstrual spotting and usually lasting less than 6 days Contraception: condoms Last pap smear: 04/22/2019. Results were: LGSIL, with few cells suggestive of a higher grade lesion.    Past Medical History:  Diagnosis Date  . Anemia    thalassemia Beta minor  . H/O chlamydia infection 2011  . Pregnancy   . Thalassemia, beta (HCC) 05/18/2014  . Worsening headaches     Past Surgical History:  Procedure Laterality Date  . NO PAST SURGERIES      OB History  Gravida Para Term Preterm AB Living  6 4 4   2 4   SAB TAB Ectopic Multiple Live Births  1     0 4    # Outcome Date GA Lbr Len/2nd Weight Sex Delivery Anes PTL Lv  6 Term 10/01/17 2970w2d  7 lb 12.9 oz (3.54 kg) F Vag-Spont None  LIV  5 SAB 06/08/16 6424w6d         4 Term 08/30/14 20324w6d 01:44 / 00:23 6 lb 13 oz (3.09 kg) M Vag-Spont None  LIV  3 AB 2012 234w0d    TAB     2 Term 07/22/10 4557w0d  6 lb 8 oz (2.948 kg) F Vag-Spont None N LIV  1 Term 09/15/08 1857w0d  6 lb 7 oz (2.92 kg) F Vag-Spont None N LIV     Family History  Problem Relation Age of Onset  . Healthy Mother   . Healthy Father   . Healthy Brother   . Healthy Brother   . Healthy Brother   . Healthy Brother   . Healthy Sister   . Diabetes Paternal Grandmother   . Cancer Paternal Aunt        unknown cancer    Social History   Socioeconomic History  . Marital status: Single    Spouse name: Not on file  . Number of children: 2  . Years of education: 2311  . Highest education level: Not on file  Occupational History    Employer: UNEMPLOYED  Social Needs  . Financial resource strain: Not on file   . Food insecurity    Worry: Not on file    Inability: Not on file  . Transportation needs    Medical: Not on file    Non-medical: Not on file  Tobacco Use  . Smoking status: Never Smoker  . Smokeless tobacco: Never Used  Substance and Sexual Activity  . Alcohol use: Yes  . Drug use: No  . Sexual activity: Yes    Birth control/protection: None  Lifestyle  . Physical activity    Days per week: Not on file    Minutes per session: Not on file  . Stress: Not on file  Relationships  . Social Musicianconnections    Talks on phone: Not on file    Gets together: Not on file    Attends religious service: Not on file    Active member of club or organization: Not on file    Attends meetings of clubs or organizations: Not on file    Relationship status: Not on file  . Intimate partner violence    Fear of current or ex partner: Not on file  Emotionally abused: Not on file    Physically abused: Not on file    Forced sexual activity: Not on file  Other Topics Concern  . Not on file  Social History Narrative   Patient is single and lives with her mother.   Patient has two children.   Patient is not currently not working.   Patient has a high school education.   Patient is right-handed.   Patient drinks very little soda- not everyday.    No current outpatient medications on file prior to visit.   No current facility-administered medications on file prior to visit.    No Known Allergies    Review of Systems Constitutional: No recent fever/chills/sweats Respiratory: No recent cough/bronchitis Cardiovascular: No chest pain Gastrointestinal: No recent nausea/vomiting/diarrhea Genitourinary: No UTI symptoms Hematologic/lymphatic:No history of coagulopathy or recent blood thinner use    Objective:   Blood pressure 111/79, pulse 90, height 5\' 10"  (1.778 m), weight 166 lb 3.2 oz (75.4 kg), last menstrual period 04/13/2019, not currently breastfeeding. CONSTITUTIONAL: Well-developed,  well-nourished female in no acute distress.  HENT:  Normocephalic, atraumatic, External right and left ear normal. Oropharynx is clear and moist EYES: Conjunctivae and EOM are normal. Pupils are equal, round, and reactive to light. No scleral icterus.  NECK: Normal range of motion, supple, no masses SKIN: Skin is warm and dry. No rash noted. Not diaphoretic. No erythema. No pallor. NEUROLOGIC: Alert and oriented to person, place, and time. Normal reflexes, muscle tone coordination. No cranial nerve deficit noted. PSYCHIATRIC: Normal mood and affect. Normal behavior. Normal judgment and thought content. CARDIOVASCULAR: Normal heart rate noted, regular rhythm RESPIRATORY: Effort and breath sounds normal, no problems with respiration noted ABDOMEN: Soft, nontender, nondistended. PELVIC: Deferred MUSCULOSKELETAL: Normal range of motion. No edema and no tenderness. 2+ distal pulses.    Labs: No results found for this or any previous visit (from the past 336 hour(s)).   Pathology: 04/01/2017: HGSIL pap smear. 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  04/22/2019: LGSIL, with few cells suggestive of a higher grade lesion. Colposcopy done same day with CIN I at 12 and 5 o'clock, CIN III at 2 o'clock, ECC with CIN I.    Imaging Studies: No results found.  Assessment:    Severe cervical dysplasia (CIN III)  Plan:    Counseling: Procedure, risks, reasons, benefits and complications (including injury to bladder, major blood vessel, , bleeding, possibility of transfusion, infection, or fistula formation) reviewed in detail. Likelihood of success in alleviating the patient's condition was discussed. Routine postoperative instructions will be reviewed with the patient and her family in detail after surgery.  The patient concurred with the proposed plan, giving informed written consent for the surgery.   Preop testing ordered. Instructions reviewed, including NPO after  midnight.      Rubie Maid, MD [facility]

## 2019-05-06 NOTE — H&P (View-Only) (Signed)
  GYNECOLOGY PREOPERATIVE HISTORY AND PHYSICAL   Subjective:  Dorothy Young is a 28 y.o. G6P4024 here for surgical management of severe cervical dysplasia (CIN III). No significant preoperative concerns.   Proposed surgery: Loop Electrode Excision Procedure (LEEP)   Pertinent Gynecological History: Menses: flow is moderate, regular every month without intermenstrual spotting and usually lasting less than 6 days Contraception: condoms Last pap smear: 04/22/2019. Results were: LGSIL, with few cells suggestive of a higher grade lesion.    Past Medical History:  Diagnosis Date  . Anemia    thalassemia Beta minor  . H/O chlamydia infection 2011  . Pregnancy   . Thalassemia, beta (HCC) 05/18/2014  . Worsening headaches     Past Surgical History:  Procedure Laterality Date  . NO PAST SURGERIES      OB History  Gravida Para Term Preterm AB Living  6 4 4   2 4  SAB TAB Ectopic Multiple Live Births  1     0 4    # Outcome Date GA Lbr Len/2nd Weight Sex Delivery Anes PTL Lv  6 Term 10/01/17 [redacted]w[redacted]d  7 lb 12.9 oz (3.54 kg) F Vag-Spont None  LIV  5 SAB 06/08/16 [redacted]w[redacted]d         4 Term 08/30/14 [redacted]w[redacted]d 01:44 / 00:23 6 lb 13 oz (3.09 kg) M Vag-Spont None  LIV  3 AB 2012 [redacted]w[redacted]d    TAB     2 Term 07/22/10 [redacted]w[redacted]d  6 lb 8 oz (2.948 kg) F Vag-Spont None N LIV  1 Term 09/15/08 [redacted]w[redacted]d  6 lb 7 oz (2.92 kg) F Vag-Spont None N LIV     Family History  Problem Relation Age of Onset  . Healthy Mother   . Healthy Father   . Healthy Brother   . Healthy Brother   . Healthy Brother   . Healthy Brother   . Healthy Sister   . Diabetes Paternal Grandmother   . Cancer Paternal Aunt        unknown cancer    Social History   Socioeconomic History  . Marital status: Single    Spouse name: Not on file  . Number of children: 2  . Years of education: 11  . Highest education level: Not on file  Occupational History    Employer: UNEMPLOYED  Social Needs  . Financial resource strain: Not on file   . Food insecurity    Worry: Not on file    Inability: Not on file  . Transportation needs    Medical: Not on file    Non-medical: Not on file  Tobacco Use  . Smoking status: Never Smoker  . Smokeless tobacco: Never Used  Substance and Sexual Activity  . Alcohol use: Yes  . Drug use: No  . Sexual activity: Yes    Birth control/protection: None  Lifestyle  . Physical activity    Days per week: Not on file    Minutes per session: Not on file  . Stress: Not on file  Relationships  . Social connections    Talks on phone: Not on file    Gets together: Not on file    Attends religious service: Not on file    Active member of club or organization: Not on file    Attends meetings of clubs or organizations: Not on file    Relationship status: Not on file  . Intimate partner violence    Fear of current or ex partner: Not on file      Emotionally abused: Not on file    Physically abused: Not on file    Forced sexual activity: Not on file  Other Topics Concern  . Not on file  Social History Narrative   Patient is single and lives with her mother.   Patient has two children.   Patient is not currently not working.   Patient has a high school education.   Patient is right-handed.   Patient drinks very little soda- not everyday.    No current outpatient medications on file prior to visit.   No current facility-administered medications on file prior to visit.    No Known Allergies    Review of Systems Constitutional: No recent fever/chills/sweats Respiratory: No recent cough/bronchitis Cardiovascular: No chest pain Gastrointestinal: No recent nausea/vomiting/diarrhea Genitourinary: No UTI symptoms Hematologic/lymphatic:No history of coagulopathy or recent blood thinner use    Objective:   Blood pressure 111/79, pulse 90, height 5\' 10"  (1.778 m), weight 166 lb 3.2 oz (75.4 kg), last menstrual period 04/13/2019, not currently breastfeeding. CONSTITUTIONAL: Well-developed,  well-nourished female in no acute distress.  HENT:  Normocephalic, atraumatic, External right and left ear normal. Oropharynx is clear and moist EYES: Conjunctivae and EOM are normal. Pupils are equal, round, and reactive to light. No scleral icterus.  NECK: Normal range of motion, supple, no masses SKIN: Skin is warm and dry. No rash noted. Not diaphoretic. No erythema. No pallor. NEUROLOGIC: Alert and oriented to person, place, and time. Normal reflexes, muscle tone coordination. No cranial nerve deficit noted. PSYCHIATRIC: Normal mood and affect. Normal behavior. Normal judgment and thought content. CARDIOVASCULAR: Normal heart rate noted, regular rhythm RESPIRATORY: Effort and breath sounds normal, no problems with respiration noted ABDOMEN: Soft, nontender, nondistended. PELVIC: Deferred MUSCULOSKELETAL: Normal range of motion. No edema and no tenderness. 2+ distal pulses.    Labs: No results found for this or any previous visit (from the past 336 hour(s)).   Pathology: 04/01/2017: HGSIL pap smear. 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  04/22/2019: LGSIL, with few cells suggestive of a higher grade lesion. Colposcopy done same day with CIN I at 12 and 5 o'clock, CIN III at 2 o'clock, ECC with CIN I.    Imaging Studies: No results found.  Assessment:    Severe cervical dysplasia (CIN III)  Plan:    Counseling: Procedure, risks, reasons, benefits and complications (including injury to bladder, major blood vessel, , bleeding, possibility of transfusion, infection, or fistula formation) reviewed in detail. Likelihood of success in alleviating the patient's condition was discussed. Routine postoperative instructions will be reviewed with the patient and her family in detail after surgery.  The patient concurred with the proposed plan, giving informed written consent for the surgery.   Preop testing ordered. Instructions reviewed, including NPO after  midnight.      Rubie Maid, MD [facility]

## 2019-05-26 ENCOUNTER — Encounter
Admission: RE | Admit: 2019-05-26 | Discharge: 2019-05-26 | Disposition: A | Discharge status: Home / Self Care | Payer: Medicaid Other | Source: Ambulatory Visit | Attending: Obstetrics and Gynecology | Admitting: Obstetrics and Gynecology

## 2019-05-26 ENCOUNTER — Other Ambulatory Visit: Payer: Self-pay

## 2019-05-26 ENCOUNTER — Other Ambulatory Visit: Payer: Self-pay | Admitting: Obstetrics and Gynecology

## 2019-05-26 DIAGNOSIS — Z20828 Contact with and (suspected) exposure to other viral communicable diseases: Secondary | ICD-10-CM | POA: Insufficient documentation

## 2019-05-26 DIAGNOSIS — Z01812 Encounter for preprocedural laboratory examination: Secondary | ICD-10-CM | POA: Insufficient documentation

## 2019-05-26 LAB — CBC
HCT: 35.9 % — ABNORMAL LOW (ref 36.0–46.0)
Hemoglobin: 11.4 g/dL — ABNORMAL LOW (ref 12.0–15.0)
MCH: 25.3 pg — ABNORMAL LOW (ref 26.0–34.0)
MCHC: 31.8 g/dL (ref 30.0–36.0)
MCV: 79.8 fL — ABNORMAL LOW (ref 80.0–100.0)
Platelets: 172 10*3/uL (ref 150–400)
RBC: 4.5 MIL/uL (ref 3.87–5.11)
RDW: 14.6 % (ref 11.5–15.5)
WBC: 4.6 10*3/uL (ref 4.0–10.5)
nRBC: 0 % (ref 0.0–0.2)

## 2019-05-26 NOTE — Patient Instructions (Signed)
Your procedure is scheduled on: Monday 05/30/19.  Report to DAY SURGERY DEPARTMENT LOCATED ON 2ND FLOOR MEDICAL MALL ENTRANCE. To find out your arrival time please call [phone] between 1PM - 3PM on Friday 05/27/19.   Remember: Instructions that are not followed completely may result in serious medical risk, up to and including death, or upon the discretion of your surgeon and anesthesiologist your surgery may need to be rescheduled.      _X__ 1. Do not eat food after midnight the night before your procedure.                 No gum chewing or hard candies. You may drink clear liquids up to 2 hours                 before you are scheduled to arrive for your surgery- DO NOT drink clear                 liquids within 2 hours of the start of your surgery.                 Clear Liquids include:  water, apple juice without pulp, clear carbohydrate                 drink such as Clearfast or Gatorade, Black Coffee or Tea (Do not add                 milk or creamer to coffee or tea).    __X__2.  On the morning of surgery brush your teeth with toothpaste and water, you may rinse your mouth with mouthwash if you wish.  Do not swallow any toothpaste or mouthwash.       _X__ 3.  No Alcohol for 24 hours before or after surgery.     __X__4.  Notify your doctor if there is any change in your medical condition      (cold, fever, infections).    Do not wear jewelry, make-up, hairpins, clips or nail polish. Do not wear lotions, powders, or perfumes.  Do not shave 48 hours prior to surgery. Men may shave face and neck. Do not bring valuables to the hospital.     [facility] is not responsible for any belongings or valuables.   Contacts, dentures/partials or body piercings may not be worn into surgery. Bring a case for your contacts, glasses or hearing aids, a denture cup will be supplied.     Patients discharged the day of surgery will not be allowed to drive home.     Please read  over the following fact sheets that you were given:   MRSA Information    __X__ Take these medicines the morning of surgery with A SIP OF WATER:     1. Tylenol      __X__ Stop Anti-inflammatories 7 days before surgery such as Advil, Ibuprofen, Motrin, BC or Goodies Powder, Naprosyn, Naproxen, Aleve, Aspirin, Meloxicam. May take Tylenol if needed for pain or discomfort.     __X__ Don't start taking any new herbal supplements before your surgery.

## 2019-05-27 LAB — SARS CORONAVIRUS 2 (TAT 6-24 HRS): SARS Coronavirus 2: NEGATIVE

## 2019-05-30 ENCOUNTER — Ambulatory Visit: Payer: Medicaid Other | Admitting: Certified Registered Nurse Anesthetist

## 2019-05-30 ENCOUNTER — Encounter
Admission: RE | Disposition: A | Discharge status: Home / Self Care | Payer: Self-pay | Source: Home / Self Care | Attending: Obstetrics and Gynecology

## 2019-05-30 ENCOUNTER — Ambulatory Visit
Admission: RE | Admit: 2019-05-30 | Discharge: 2019-05-30 | Disposition: A | Discharge status: Home / Self Care | Payer: Medicaid Other | Attending: Obstetrics and Gynecology | Admitting: Obstetrics and Gynecology

## 2019-05-30 ENCOUNTER — Other Ambulatory Visit: Payer: Self-pay

## 2019-05-30 DIAGNOSIS — Z9889 Other specified postprocedural states: Secondary | ICD-10-CM

## 2019-05-30 DIAGNOSIS — D563 Thalassemia minor: Secondary | ICD-10-CM | POA: Insufficient documentation

## 2019-05-30 DIAGNOSIS — D069 Carcinoma in situ of cervix, unspecified: Secondary | ICD-10-CM | POA: Diagnosis present

## 2019-05-30 HISTORY — PX: LEEP: SHX91

## 2019-05-30 LAB — POCT PREGNANCY, URINE: Preg Test, Ur: NEGATIVE

## 2019-05-30 SURGERY — LEEP (LOOP ELECTROSURGICAL EXCISION PROCEDURE)
Anesthesia: General | Site: Cervix

## 2019-05-30 MED ORDER — FERRIC SUBSULFATE 259 MG/GM EX SOLN
CUTANEOUS | Status: DC | PRN
  Administered 2019-05-30: 2

## 2019-05-30 MED ORDER — FAMOTIDINE 20 MG PO TABS
20.0000 mg | ORAL_TABLET | Freq: Once | ORAL | Status: AC
  Administered 2019-05-30: 09:00:00 20 mg via ORAL

## 2019-05-30 MED ORDER — MIDAZOLAM HCL 2 MG/2ML IJ SOLN
INTRAMUSCULAR | Status: AC
  Filled 2019-05-30: qty 2

## 2019-05-30 MED ORDER — DEXAMETHASONE SODIUM PHOSPHATE 10 MG/ML IJ SOLN
INTRAMUSCULAR | Status: AC
  Filled 2019-05-30: qty 1

## 2019-05-30 MED ORDER — DEXAMETHASONE SODIUM PHOSPHATE 10 MG/ML IJ SOLN
INTRAMUSCULAR | Status: DC | PRN
  Administered 2019-05-30: 10 mg via INTRAVENOUS

## 2019-05-30 MED ORDER — PHENYLEPHRINE HCL (PRESSORS) 10 MG/ML IV SOLN
INTRAVENOUS | Status: DC | PRN
  Administered 2019-05-30 (×2): 150 ug via INTRAVENOUS
  Administered 2019-05-30 (×2): 100 ug via INTRAVENOUS

## 2019-05-30 MED ORDER — ACETAMINOPHEN 500 MG PO TABS
ORAL_TABLET | ORAL | Status: AC
  Administered 2019-05-30: 09:00:00 1000 mg via ORAL
  Filled 2019-05-30: qty 2

## 2019-05-30 MED ORDER — MIDAZOLAM HCL 2 MG/2ML IJ SOLN
INTRAMUSCULAR | Status: DC | PRN
  Administered 2019-05-30: 2 mg via INTRAVENOUS

## 2019-05-30 MED ORDER — FENTANYL CITRATE (PF) 100 MCG/2ML IJ SOLN: 25.0000 ug | INTRAMUSCULAR | Status: DC | PRN

## 2019-05-30 MED ORDER — PROPOFOL 10 MG/ML IV BOLUS
INTRAVENOUS | Status: AC
  Filled 2019-05-30: qty 20

## 2019-05-30 MED ORDER — ONDANSETRON HCL 4 MG/2ML IJ SOLN
INTRAMUSCULAR | Status: DC | PRN
  Administered 2019-05-30: 4 mg via INTRAVENOUS

## 2019-05-30 MED ORDER — ONDANSETRON HCL 4 MG/2ML IJ SOLN
INTRAMUSCULAR | Status: AC
  Filled 2019-05-30: qty 2

## 2019-05-30 MED ORDER — IODINE STRONG (LUGOLS) 5 % PO SOLN
ORAL | Status: AC
  Filled 2019-05-30: qty 1

## 2019-05-30 MED ORDER — KETOROLAC TROMETHAMINE 60 MG/2ML IM SOLN
INTRAMUSCULAR | Status: AC
  Administered 2019-05-30: 09:00:00 30 mg
  Filled 2019-05-30: qty 2

## 2019-05-30 MED ORDER — IODINE STRONG (LUGOLS) 5 % PO SOLN
ORAL | Status: DC | PRN
  Administered 2019-05-30: 4 mL

## 2019-05-30 MED ORDER — FERRIC SUBSULFATE 259 MG/GM EX SOLN
CUTANEOUS | Status: AC
  Filled 2019-05-30: qty 8

## 2019-05-30 MED ORDER — FENTANYL CITRATE (PF) 100 MCG/2ML IJ SOLN
INTRAMUSCULAR | Status: AC
  Filled 2019-05-30: qty 2

## 2019-05-30 MED ORDER — FAMOTIDINE 20 MG PO TABS
ORAL_TABLET | ORAL | Status: AC
  Administered 2019-05-30: 09:00:00 20 mg via ORAL
  Filled 2019-05-30: qty 1

## 2019-05-30 MED ORDER — LIDOCAINE-EPINEPHRINE 1 %-1:100000 IJ SOLN
INTRAMUSCULAR | Status: DC | PRN
  Administered 2019-05-30: 10 mL

## 2019-05-30 MED ORDER — LACTATED RINGERS IV SOLN
INTRAVENOUS | Status: DC
  Administered 2019-05-30: 125 mL/h via INTRAVENOUS

## 2019-05-30 MED ORDER — ONDANSETRON HCL 4 MG/2ML IJ SOLN: 4.0000 mg | Freq: Once | INTRAMUSCULAR | Status: DC | PRN

## 2019-05-30 MED ORDER — IBUPROFEN 800 MG PO TABS: 800.0000 mg | ORAL_TABLET | Freq: Three times a day (TID) | ORAL | 0 refills | Status: DC | PRN

## 2019-05-30 MED ORDER — LIDOCAINE HCL (PF) 1 % IJ SOLN
INTRAMUSCULAR | Status: AC
  Filled 2019-05-30: qty 30

## 2019-05-30 MED ORDER — LIDOCAINE HCL (CARDIAC) PF 100 MG/5ML IV SOSY
PREFILLED_SYRINGE | INTRAVENOUS | Status: DC | PRN
  Administered 2019-05-30: 100 mg via INTRAVENOUS

## 2019-05-30 MED ORDER — ACETAMINOPHEN 500 MG PO TABS
1000.0000 mg | ORAL_TABLET | ORAL | Status: AC
  Administered 2019-05-30: 09:00:00 1000 mg via ORAL

## 2019-05-30 MED ORDER — LIDOCAINE HCL (PF) 2 % IJ SOLN
INTRAMUSCULAR | Status: AC
  Filled 2019-05-30: qty 10

## 2019-05-30 MED ORDER — PROPOFOL 10 MG/ML IV BOLUS
INTRAVENOUS | Status: DC | PRN
  Administered 2019-05-30: 150 mg via INTRAVENOUS
  Administered 2019-05-30: 50 mg via INTRAVENOUS

## 2019-05-30 MED ORDER — FENTANYL CITRATE (PF) 100 MCG/2ML IJ SOLN
INTRAMUSCULAR | Status: DC | PRN
  Administered 2019-05-30 (×4): 25 ug via INTRAVENOUS

## 2019-05-30 MED ORDER — LIDOCAINE-EPINEPHRINE 1 %-1:100000 IJ SOLN
INTRAMUSCULAR | Status: AC
  Filled 2019-05-30: qty 1

## 2019-05-30 MED ORDER — KETOROLAC TROMETHAMINE 15 MG/ML IJ SOLN
15.0000 mg | INTRAMUSCULAR | Status: AC
  Administered 2019-05-30: 09:00:00 15 mg via INTRAVENOUS
  Filled 2019-05-30: qty 1

## 2019-05-30 SURGICAL SUPPLY — 33 items
APPLICATOR COTTON TIP 6IN STRL (MISCELLANEOUS) ×2 IMPLANT
APPLICATOR SWAB PROCTO LG 16IN (MISCELLANEOUS) ×2 IMPLANT
CANISTER SUCT 1200ML W/VALVE (MISCELLANEOUS) ×2 IMPLANT
CATH ROBINSON RED A/P 16FR (CATHETERS) ×2 IMPLANT
COVER WAND RF STERILE (DRAPES) IMPLANT
DRAPE UNDER BUTTOCK W/FLU (DRAPES) ×2 IMPLANT
DRSG TELFA 3X8 NADH (GAUZE/BANDAGES/DRESSINGS) ×2 IMPLANT
ELECT LEEP BALL 5MM 12CM (MISCELLANEOUS) ×2
ELECT LEEP LOOP 1.0CM .7CM (MISCELLANEOUS)
ELECT LEEP LOOP 20X10 R2010 (MISCELLANEOUS) ×2
ELECT LOOP 1.0X1.0CM R1010 (MISCELLANEOUS)
ELECT REM PT RETURN 9FT ADLT (ELECTROSURGICAL) ×2
ELECTRODE LEEP BALL 5MM 12CM (MISCELLANEOUS) ×1 IMPLANT
ELECTRODE LEEP LOOP 1.0CM .7CM (MISCELLANEOUS) IMPLANT
ELECTRODE LEP LOOP 20X10 R2010 (MISCELLANEOUS) ×1 IMPLANT
ELECTRODE LOOP 1.0X1.0CM R1010 (MISCELLANEOUS) IMPLANT
ELECTRODE REM PT RTRN 9FT ADLT (ELECTROSURGICAL) ×1 IMPLANT
GLOVE BIO SURGEON STRL SZ 6.5 (GLOVE) ×2 IMPLANT
GLOVE INDICATOR 7.0 STRL GRN (GLOVE) ×2 IMPLANT
GOWN STRL REUS W/ TWL LRG LVL3 (GOWN DISPOSABLE) ×2 IMPLANT
GOWN STRL REUS W/TWL LRG LVL3 (GOWN DISPOSABLE) ×2
HANDLE YANKAUER SUCT BULB TIP (MISCELLANEOUS) ×2 IMPLANT
KIT TURNOVER CYSTO (KITS) ×2 IMPLANT
LABEL OR SOLS (LABEL) IMPLANT
NEEDLE SPNL 22GX3.5 QUINCKE BK (NEEDLE) ×2 IMPLANT
PACK DNC HYST (MISCELLANEOUS) ×2 IMPLANT
PAD PREP 24X41 OB/GYN DISP (PERSONAL CARE ITEMS) ×2 IMPLANT
PENCIL ELECTRO HAND CTR (MISCELLANEOUS) ×2 IMPLANT
SOL PREP PVP 2OZ (MISCELLANEOUS) ×2
SOLUTION PREP PVP 2OZ (MISCELLANEOUS) ×1 IMPLANT
STRAW SMOKE EVAC LEEP 6150 NON (MISCELLANEOUS) ×2 IMPLANT
SUT VIC AB 2-0 CT1 (SUTURE) ×2 IMPLANT
SYR 10ML LL (SYRINGE) ×2 IMPLANT

## 2019-05-30 NOTE — Anesthesia Procedure Notes (Signed)
Procedure Name: LMA Insertion Date/Time: 05/30/2019 10:25 AM Performed by: Johnna Acosta, CRNA Pre-anesthesia Checklist: Patient identified, Emergency Drugs available, Suction available, Patient being monitored and Timeout performed Patient Re-evaluated:Patient Re-evaluated prior to induction Oxygen Delivery Method: Circle system utilized Preoxygenation: Pre-oxygenation with 100% oxygen Induction Type: IV induction LMA: LMA inserted LMA Size: 4.0 Tube type: Oral Number of attempts: 1 Placement Confirmation: positive ETCO2 and breath sounds checked- equal and bilateral Tube secured with: Tape Dental Injury: Teeth and Oropharynx as per pre-operative assessment

## 2019-05-30 NOTE — Discharge Instructions (Signed)
Cervical Conization, Care After This sheet gives you information about how to care for yourself after your procedure. Your doctor may also give you more specific instructions. If you have problems or questions, contact your doctor. Follow these instructions at home: Medicines   Take over-the-counter and prescription medicines only as told by your doctor.  Do not take aspirin until your doctor says it is okay.  If you take pain medicine: ? You may have constipation. To help treat this, your doctor may tell you to:  Drink enough fluid to keep your pee (urine) clear or pale yellow.  Take medicines.  Eat foods that are high in fiber. These include fresh fruits and vegetables, whole grains, bran, and beans.  Limit foods that are high in fat and sugar. These include fried foods and sweet foods. ? Do not drive or use heavy machines. General instructions  You can eat your usual diet unless your doctor tells you not to do so.  Take showers for the first week. Do not take baths, swim, or use hot tubs until your doctor says it is okay.  Do not douche, use tampons, or have sex until your doctor says it is okay.  For 7-14 days after your procedure, avoid: ? Being very active. ? Exercising. ? Heavy lifting.  Keep all follow-up visits as told by your doctor. This is important. Contact a doctor if:  You have a rash.  You are dizzy or lightheaded.  You feel sick to your stomach (nauseous).  You throw up (vomit).  You have fluid from your vagina (vaginal discharge) that smells bad. Get help right away if:  There are blood clots coming from your vagina.  You have more bleeding than you would have in a normal period. For example, you soak a pad in less than 1 hour.  You have a fever.  You have more and more cramps.  You pass out (faint).  You have pain when peeing.  Your have a lot of pain.  Your pain gets worse.  Your pain does not get better when you take your  medicine.  You have blood in your pee.  You throw up (vomit). Summary  After your procedure, take over-the-counter and prescription medicines only as told by your doctor.  Do not douche, use tampons, or have sex until your doctor says it is okay.  For about 7-14 days after your procedure, try not to exercise or lift heavy objects.  Get help right away if you have new symptoms, or if your symptoms become worse. This information is not intended to replace advice given to you by your health care provider. Make sure you discuss any questions you have with your health care provider. Document Released: 05/13/2008 Document Revised: 07/17/2017 Document Reviewed: 08/06/2016 Elsevier Patient Education  2020 [place]   1) The drugs that you were given will stay in your system until tomorrow so for the next 24 hours you should not:  A) Drive an automobile B) Make any legal decisions C) Drink any alcoholic beverage   2) You may resume regular meals tomorrow.  Today it is better to start with liquids and gradually work up to solid foods.  You may eat anything you prefer, but it is better to start with liquids, then soup and crackers, and gradually work up to solid foods.   3) Please notify your doctor immediately if you have any unusual bleeding, trouble breathing, redness and pain at the  surgery site, drainage, fever, or pain not relieved by medication.    4) Additional Instructions:        Please contact your physician with any problems or Same Day Surgery at [phone], Monday through Friday 6 am to 4 pm, or [place] at [facility] number at [phone].

## 2019-05-30 NOTE — Interval H&P Note (Signed)
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  The various methods of treatment have been discussed with the patient and family. After consideration of risks, benefits and other options for treatment, the patient has consented to  Procedure(s): LOOP ELECTROSURGICAL EXCISION PROCEDURE (LEEP) (N/A) as a surgical intervention.  The patient's history has been reviewed, patient examined, no change in status, stable for surgery.  I have reviewed the patient's chart and labs.  Questions were answered to the patient's satisfaction.     Rubie Maid, MD [facility]

## 2019-05-30 NOTE — Anesthesia Preprocedure Evaluation (Signed)
Anesthesia Evaluation  Patient identified by MRN, date of birth, ID band Patient awake    Reviewed: Allergy & Precautions, NPO status , Patient's Chart, lab work & pertinent test results  Airway Mallampati: II  TM Distance: >3 FB     Dental no notable dental hx.    Pulmonary neg pulmonary ROS,    Pulmonary exam normal        Cardiovascular negative cardio ROS Normal cardiovascular exam     Neuro/Psych  Headaches, negative psych ROS   GI/Hepatic negative GI ROS, Neg liver ROS,   Endo/Other  Hyperthyroidism   Renal/GU negative Renal ROS  Female GU complaint     Musculoskeletal negative musculoskeletal ROS (+)   Abdominal Normal abdominal exam  (+)   Peds negative pediatric ROS (+)  Hematology  (+) anemia ,   Anesthesia Other Findings Past Medical History: No date: Anemia     Comment:  thalassemia Beta minor 2011: H/O chlamydia infection No date: Pregnancy 05/18/2014: Thalassemia, beta ([place]) No date: Worsening headaches  Reproductive/Obstetrics                             Anesthesia Physical Anesthesia Plan  ASA: II  Anesthesia Plan: General   Post-op Pain Management:    Induction: Intravenous  PONV Risk Score and Plan:   Airway Management Planned: LMA  Additional Equipment:   Intra-op Plan:   Post-operative Plan: Extubation in OR  Informed Consent: I have reviewed the patients History and Physical, chart, labs and discussed the procedure including the risks, benefits and alternatives for the proposed anesthesia with the patient or authorized representative who has indicated his/her understanding and acceptance.     Dental advisory given  Plan Discussed with: CRNA and Surgeon  Anesthesia Plan Comments:         Anesthesia Quick Evaluation

## 2019-05-30 NOTE — Transfer of Care (Signed)
Immediate Anesthesia Transfer of Care Note  Patient: Dorothy Young  Procedure(s) Performed: LOOP ELECTROSURGICAL EXCISION PROCEDURE (LEEP) (N/A Cervix)  Patient Location: PACU  Anesthesia Type:General  Level of Consciousness: awake and alert   Airway & Oxygen Therapy: Patient Spontanous Breathing and Patient connected to face mask oxygen  Post-op Assessment: Report given to RN and Post -op Vital signs reviewed and stable  Post vital signs: Reviewed and stable  Last Vitals:  Vitals Value Taken Time  BP 123/73 05/30/19 1123  Temp 36.2 C 05/30/19 1123  Pulse 73 05/30/19 1123  Resp 16 05/30/19 1123  SpO2 100 % 05/30/19 1123  Vitals shown include unvalidated device data.  Last Pain:  Vitals:   05/30/19 1123  TempSrc:   PainSc: Asleep         Complications: No apparent anesthesia complications

## 2019-05-30 NOTE — Anesthesia Post-op Follow-up Note (Signed)
Anesthesia QCDR form completed.        

## 2019-05-30 NOTE — Op Note (Signed)
Procedure(s): LOOP ELECTROSURGICAL EXCISION PROCEDURE (LEEP) Procedure Note  Dorothy Young female 28 y.o. 05/30/2019  Indications: The patient is a 28 y.o. [id number] female with history of cervical dysplasia.As of 04/22/2019, she had an LGSIL pap with few cells suggestive of a higher grade lesion.  Colposcopy noted CIN I at 12 and 5 o'clock, CIN III at 2 o'clock, ECC with CIN I.    Pre-operative Diagnosis: Moderate to severe cervical dysplasia.   Post-operative Diagnosis: Samen  Surgeon: Rubie Maid, MD  Assistants:  None  Anesthesia: General endotracheal anesthesia  Findings: The cervix was noted to have areas of decreased uptake at 12 and 2-4 o'clock regions.   Procedure Details: The patient was seen in the Holding Room. The risks, benefits, complications, treatment options, and expected outcomes were discussed with the patient.  The patient concurred with the proposed plan, giving informed consent.  The site of surgery properly noted/marked. The patient was taken to the Operating Room, identified as Dorothy Young and the procedure verified as Procedure(s) (LRB): LOOP ELECTROSURGICAL EXCISION PROCEDURE (LEEP) (N/A). A Time Out was held and the above information confirmed.  She was then placed under general anesthesia without difficulty. She was placed in the dorsal lithotomy position, and was prepped and draped in a sterile manner.  A straight catheterization was performed.  The bivalved coated speculum was placed in the patient's vagina. Lugol's solution was applied to the cervix and areas of decreased uptake were noted around the transformation zone.   Local anesthesia was administered via an intracervical block using 10 ml of 1% Xylocaine with epinephrine. The suction was turned on and the Large 1X Fisher Cone Biopsy Excisor on 29 Watts of blended current was used to excise the area of decreased uptake and excise the entire transformation zone. An endocervical curettage was  then performed. Excellent hemostasis was achieved using roller ball coagulation set at 60 Watts coagulation current. Monsel's solution was then applied and the speculum was removed from the vagina. Specimens were sent to pathology.  The patient tolerated the procedure well. Post-operative instructions given to patient, including instruction to seek medical attention for persistent bright red bleeding, fever, abdominal/pelvic pain, dysuria, nausea or vomiting. She was also told about the possibility of having copious yellow to black tinged discharge for weeks. She was counseled to avoid anything in the vagina (sex/douching/tampons) for 3 weeks. She has a 3 week post-operative check to assess wound healing, review results and discuss further management.    Estimated Blood Loss: minimal (5 cc)       Drains: straight catheterization prior to procedure with  300 ml of clear urine         Total IV Fluids: 1000 ml  Specimens: Ectocervix tagged at 12 o'clock, and endocervical curettings         Implants: None         Complications:  None; patient tolerated the procedure well.         Disposition: PACU - hemodynamically stable.         Condition: stable   Rubie Maid, MD [facility]

## 2019-05-31 ENCOUNTER — Encounter: Payer: Self-pay | Admitting: Obstetrics and Gynecology

## 2019-05-31 LAB — SURGICAL PATHOLOGY

## 2019-05-31 NOTE — Anesthesia Postprocedure Evaluation (Signed)
Anesthesia Post Note  Patient: Dorothy Young  Procedure(s) Performed: LOOP ELECTROSURGICAL EXCISION PROCEDURE (LEEP) (N/A Cervix)  Patient location during evaluation: PACU Anesthesia Type: General Level of consciousness: awake and alert and oriented Pain management: pain level controlled Vital Signs Assessment: post-procedure vital signs reviewed and stable Respiratory status: spontaneous breathing Cardiovascular status: blood pressure returned to baseline Anesthetic complications: no     Last Vitals:  Vitals:   05/30/19 1153 05/30/19 1207  BP: 124/84 120/90  Pulse: 82 69  Resp: 19   Temp: (!) 36.3 C (!) 36.4 C  SpO2: 100% 100%    Last Pain:  Vitals:   05/30/19 1210  TempSrc:   PainSc: 0-No pain                 Sumayah Bearse

## 2019-06-21 ENCOUNTER — Ambulatory Visit (INDEPENDENT_AMBULATORY_CARE_PROVIDER_SITE_OTHER): Payer: Medicaid Other | Admitting: Obstetrics and Gynecology

## 2019-06-21 ENCOUNTER — Encounter: Payer: Self-pay | Admitting: Obstetrics and Gynecology

## 2019-06-21 ENCOUNTER — Other Ambulatory Visit: Payer: Self-pay

## 2019-06-21 VITALS — BP 117/79 | HR 108 | Ht 70.0 in | Wt 170.7 lb

## 2019-06-21 DIAGNOSIS — Z4889 Encounter for other specified surgical aftercare: Secondary | ICD-10-CM

## 2019-06-21 DIAGNOSIS — Z9889 Other specified postprocedural states: Secondary | ICD-10-CM

## 2019-06-21 DIAGNOSIS — R87613 High grade squamous intraepithelial lesion on cytologic smear of cervix (HGSIL): Secondary | ICD-10-CM

## 2019-06-21 NOTE — Progress Notes (Signed)
Pt present for post op after LEEP procedure. Pt stated that she was having pain in her legs, mid back and pelvic area.

## 2019-06-21 NOTE — Progress Notes (Signed)
    OBSTETRICS/GYNECOLOGY POST-OPERATIVE CLINIC VISIT  Subjective:     Dorothy Young is a 28 y.o. female who presents to the clinic 3 weeks status post LEEP for abnormal pap. Eating a regular diet without difficulty. Bowel movements are normal. Pain is controlled without any medications. No abnormal bleeding, had LMP 06/09/19 lasted 5 days with a few days of spotting after it ended. No abnormal vaginal discharge. She does note some mild hip, leg, and mid thoracic back pain since the procedure. Denies any fevers or chills  The following portions of the patient's history were reviewed and updated as appropriate: allergies, current medications, past family history, past medical history, past social history, past surgical history and problem list.  Review of Systems Pertinent items are noted in HPI.    Objective:    BP 117/79   Pulse (!) 108   Ht 5\' 10"  (1.778 m)   Wt 170 lb 11.2 oz (77.4 kg)   LMP 06/09/2019   BMI 24.49 kg/m  General:  alert and no distress  Abdomen: soft, bowel sounds active, non-tender  Pelvis:   external genitalia normal, vagina with small amount of brown discharge, no odor.  Cervix with normal granulation tissue, healing.     Pathology:  A. ECTOCERVIX; LEEP:  - HIGH-GRADE SQUAMOUS INTRAEPITHELIAL LESION (HSIL / CIN3) INVOLVING  12:00-2:00 LOCATION.  - PATCHY LOW-GRADE SQUAMOUS INTRAEPITHELIAL LESION (LSIL / CIN1)  INVOLVING ALL FOUR QUADRANTS.  - ENDO- AND ECTO-CERVICAL MARGINS ARE NEGATIVE FOR DYSPLASIA.  - CHANGES CONSISTENT WITH PRIOR BIOPSY.   B. ENDOCERVICAL CURETTAGE:  - STRIPS OF UNREMARKABLE ENDOCERVICAL GLANDULAR EPITHELIUM.  - FRAGMENTS OF PROLIFERATIVE ENDOMETRIUM.  - NEGATIVE FOR ATYPIA AND MALIGNANCY.  Assessment:    Doing well postoperatively.  S/p LEEP HGSIL pap  Plan:   1. Operative findings again reviewed. Pathology report discussed. 4. Activity restrictions: pelvic rest x 1 week 5. Anticipated return to work: now. 6.  Follow up: 4-6 months for repeat pap smears x 1 year   Rubie Maid, MD [facility]

## 2019-12-21 ENCOUNTER — Ambulatory Visit (INDEPENDENT_AMBULATORY_CARE_PROVIDER_SITE_OTHER): Payer: Medicaid Other | Admitting: Obstetrics and Gynecology

## 2019-12-21 ENCOUNTER — Encounter: Payer: Self-pay | Admitting: Obstetrics and Gynecology

## 2019-12-21 ENCOUNTER — Other Ambulatory Visit: Payer: Self-pay

## 2019-12-21 ENCOUNTER — Other Ambulatory Visit (HOSPITAL_COMMUNITY)
Admission: RE | Admit: 2019-12-21 | Discharge: 2019-12-21 | Disposition: A | Discharge status: Home / Self Care | Payer: Medicaid Other | Source: Ambulatory Visit | Attending: Obstetrics and Gynecology | Admitting: Obstetrics and Gynecology

## 2019-12-21 VITALS — BP 124/84 | HR 88 | Ht 70.0 in | Wt 175.5 lb

## 2019-12-21 DIAGNOSIS — Z87410 Personal history of cervical dysplasia: Secondary | ICD-10-CM | POA: Insufficient documentation

## 2019-12-21 DIAGNOSIS — Z3009 Encounter for other general counseling and advice on contraception: Secondary | ICD-10-CM

## 2019-12-21 NOTE — Progress Notes (Signed)
   GYNECOLOGY CLINIC PROGRESS NOTE Subjective:     Dorothy Young is a 29 y.o. woman who comes in today for a  pap smear only. Her most recent annual exam was on 2019. Her most recent Pap smear was on 05/12/2019 and showed low-grade squamous intraepithelial neoplasia (LGSIL - encompassing HPV,mild dysplasia,CIN I) with few cells suggestive of a higher grade lesion. Colposcopy noted CIN I at 12and 5 o'clock, CIN III at 2 o'clock, ECC with CIN I.  She is s/p LEEP on 05/30/2019 with HGSIL at 12-2 o'clock, LSIL in all 4 quadrants, and negative ECC. Previous abnormal Pap smears: yes - HGSIL in 2018. Contraception: none currently.  Patient's last menstrual period was 12/18/2019.   Of note, patient also desires to discuss contraception. Currently noting heavy periods in the first 1-2 days of her cycle. Cycle lasts 4-5 days typically and is regular.  Has been on Depo Provera and OCPs in the past, but did not like the way the hormones made her feel.   Review of Systems Pertinent items are noted in HPI and review of systems is otherwise negative.    Objective:    BP 124/84   Pulse 88   Ht 5\' 10"  (1.778 m)   Wt 175 lb 8 oz (79.6 kg)   LMP 12/18/2019   BMI 25.18 kg/m  Pelvic Exam:  external genitalia normal, rectovaginal septum normal.  Vagina without discharge. Scant dark red blood in vaginal vault. Cervix with post-LEEP changes, but otherwise normal appearing, Bimanual exam not performed. Pap smear obtained.   Assessment:   History of severe cervical dysplasia (CIN III) Contraception counseling  Plan:   - Pap smear done today. Will f/u based on cytology results. If normal, will continue repeat pap smear again in 6 months.  - Patient desires to discuss contraception options. Reviewed all forms of birth control options available including abstinence; fertility period awareness methods; over the counter/barrier methods; hormonal contraceptive medication including pill, patch, ring,  injection,contraceptive implant; hormonal and nonhormonal IUDs; permanent sterilization options including vasectomy and the various tubal sterilization modalities. Risks and benefits reviewed.  Questions were answered.  Information was given to patient to review. She is leaning towards the Pomaria or Paragard IUD.  Will f/u in 1 month for insertion around time of next menses.    A total of 15 minutes were spent face-to-face with the patient during this encounter and over half of that time dealt with counseling and coordination of care.   [place], MD [facility]

## 2019-12-21 NOTE — Progress Notes (Signed)
Pt present for repeat pap smear. Pt's lmp 12/18/2019. Pt stated that she is doing well no problems.

## 2019-12-26 LAB — CYTOLOGY - PAP: Diagnosis: NEGATIVE

## 2020-01-18 ENCOUNTER — Ambulatory Visit: Payer: Medicaid Other | Admitting: Obstetrics and Gynecology

## 2020-01-27 ENCOUNTER — Encounter: Payer: Self-pay | Admitting: Obstetrics and Gynecology

## 2020-02-28 IMAGING — CT CT HEAD W/O CM
3 series · 15 of 45 positions shown, 18 images · non-contrast
Comparison: None available.

CLINICAL DATA: Initial evaluation for acute severe headache.

EXAM:
CT HEAD WITHOUT CONTRAST
TECHNIQUE: Contiguous axial images were obtained from the base of the skull
through the vertex without intravenous contrast.

[Series 3: head wo · axial · 0.40mm/px · z∈[-159,-44]mm · 9 of 28 slices shown, 12 images]
[im 3/28  brain]
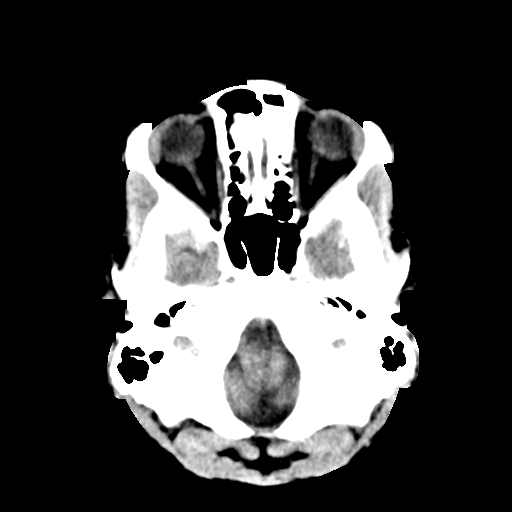
[im 3/28  bone]
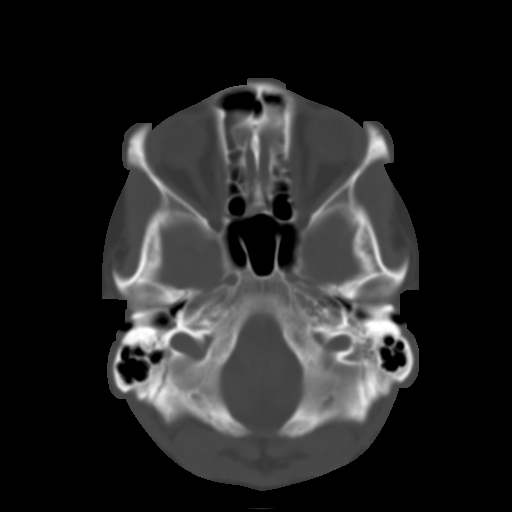
[im 6/28  brain]
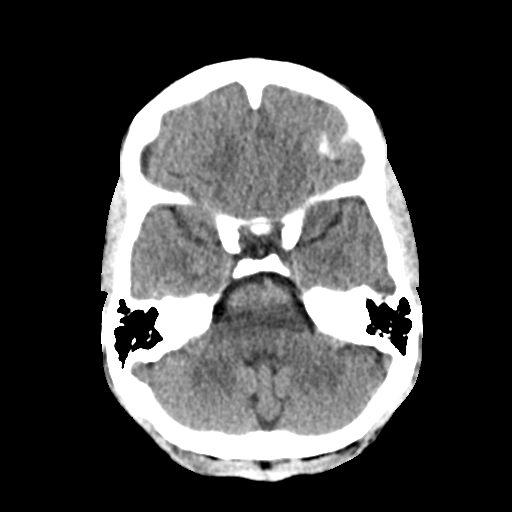
[im 9/28  brain]
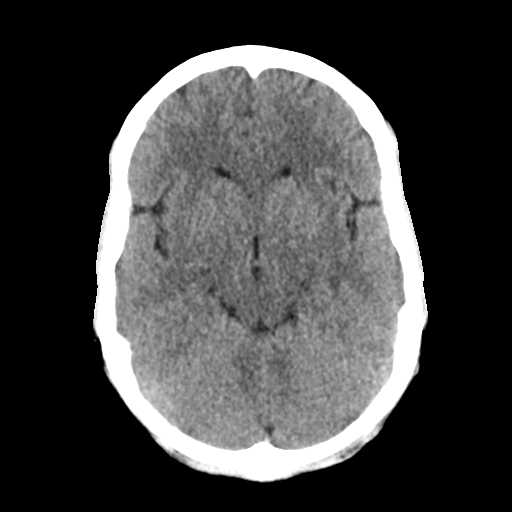
[im 12/28  brain]
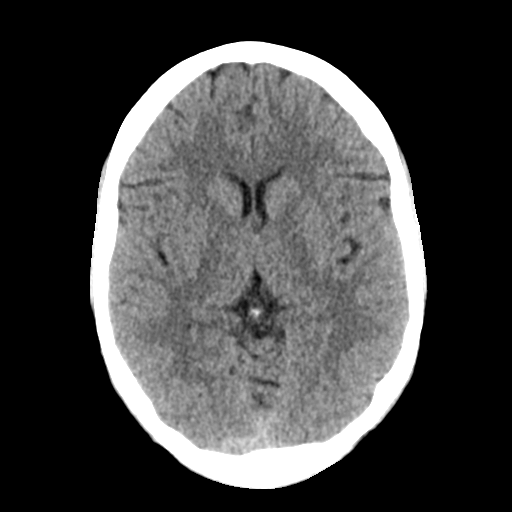
[im 15/28  brain]
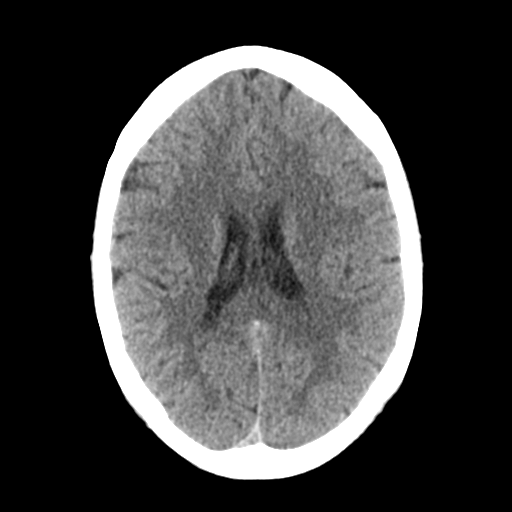
[im 15/28  bone]
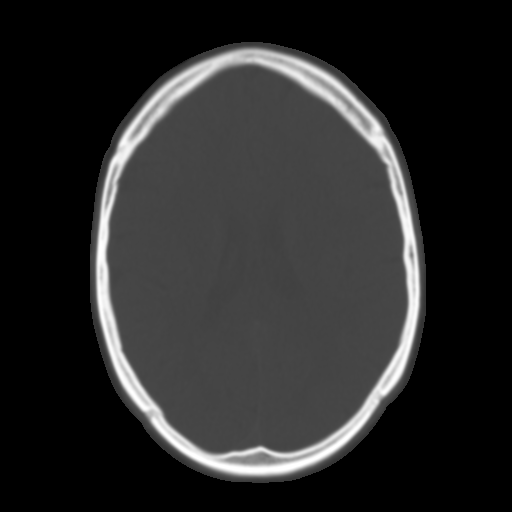
[im 17/28  brain]
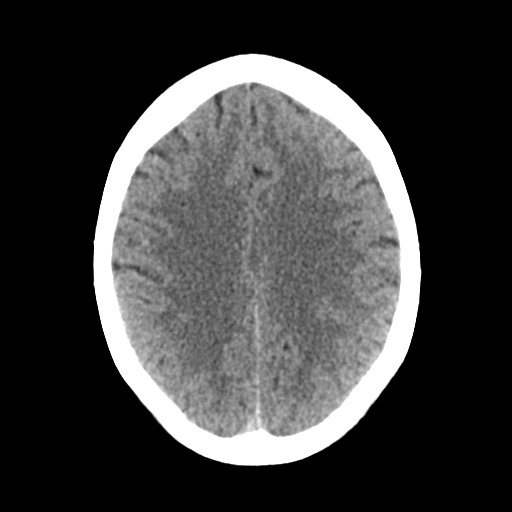
[im 20/28  brain]
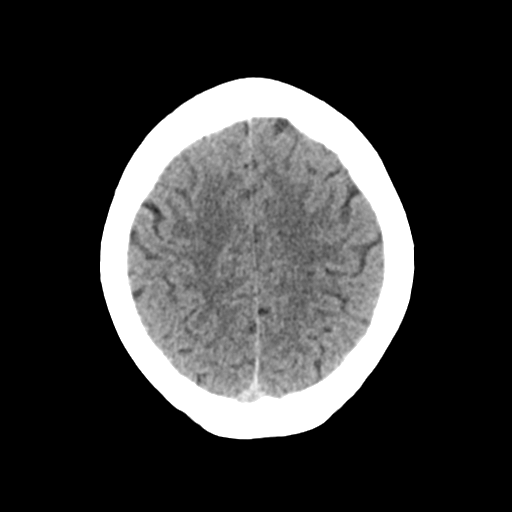
[im 23/28  brain]
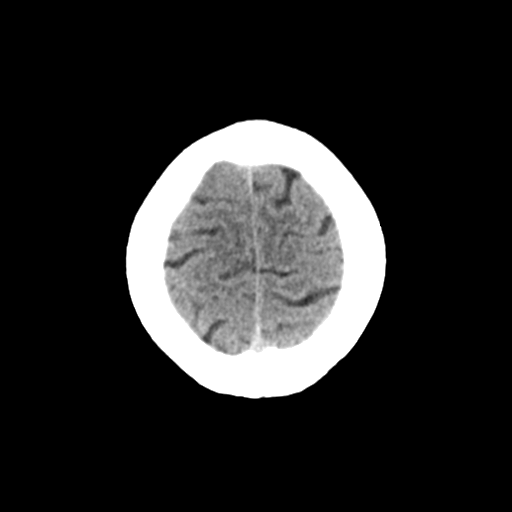
[im 26/28  brain]
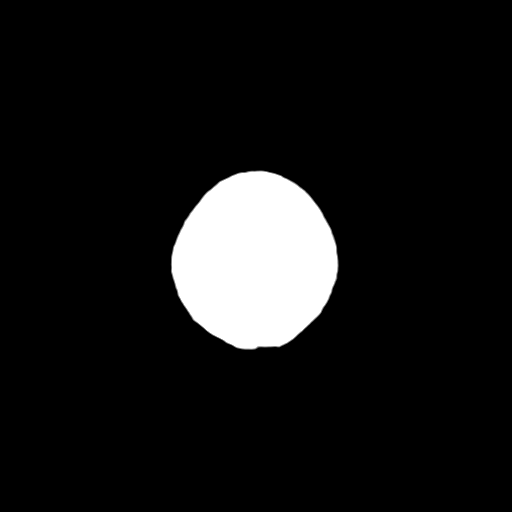
[im 26/28  bone]
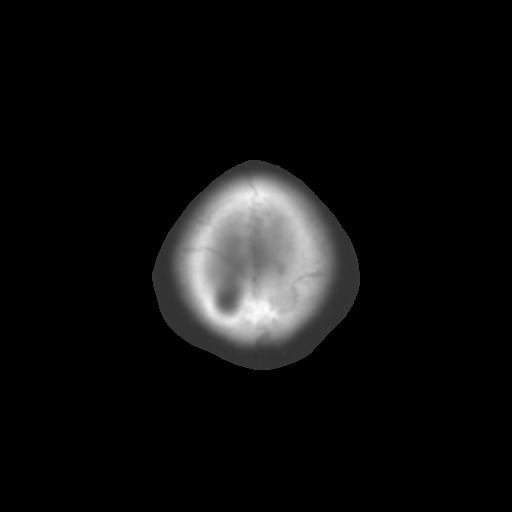

[Series 4: coronal soft tissue · coronal · 0.26mm/px · 3 of 59 slices shown]
[im 20/59  brain]
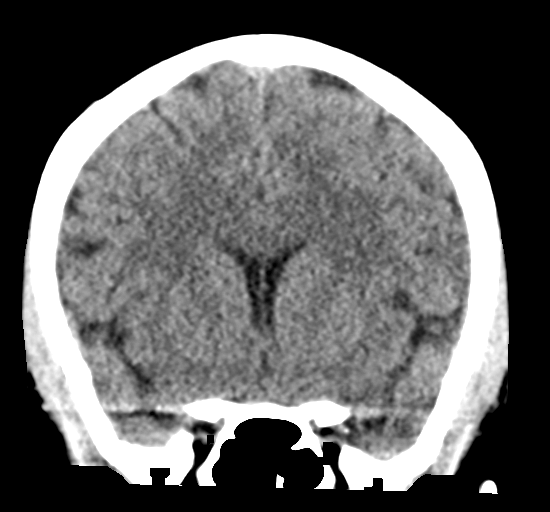
[im 26/59  brain]
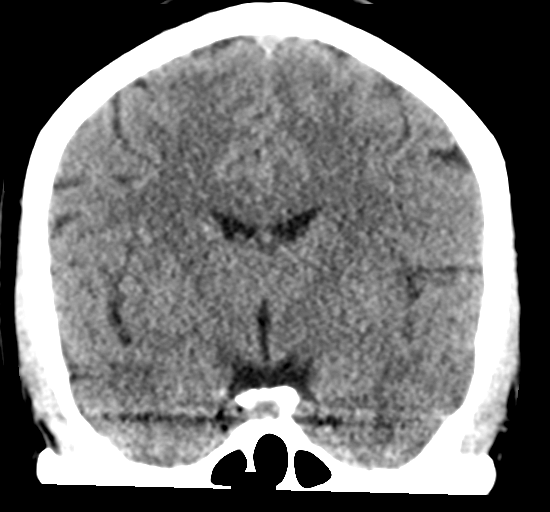
[im 33/59  brain]
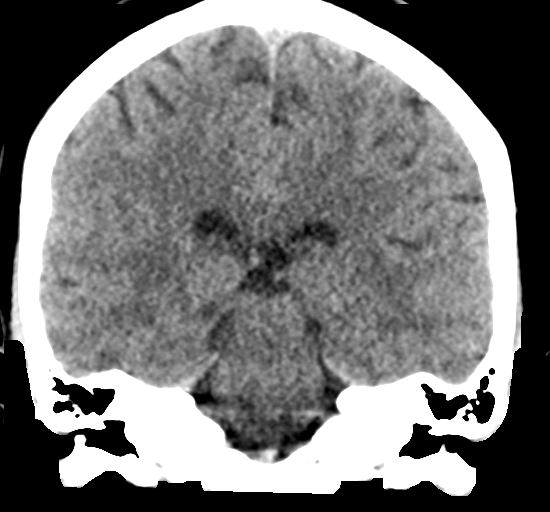

[Series 5: sagittal soft tissue · sagittal · 0.26mm/px · 3 of 48 slices shown]
[im 16/48  brain]
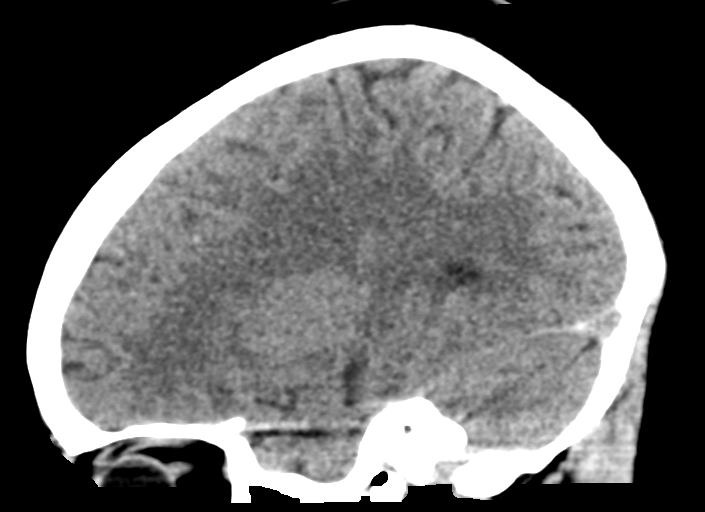
[im 24/48  brain]
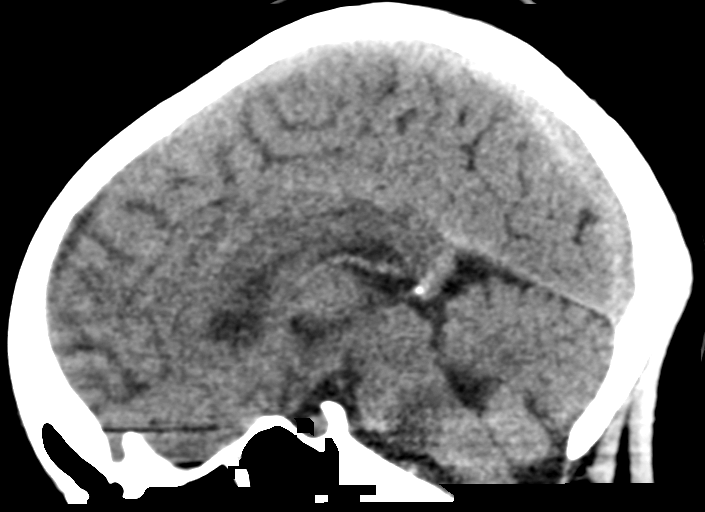
[im 32/48  brain]
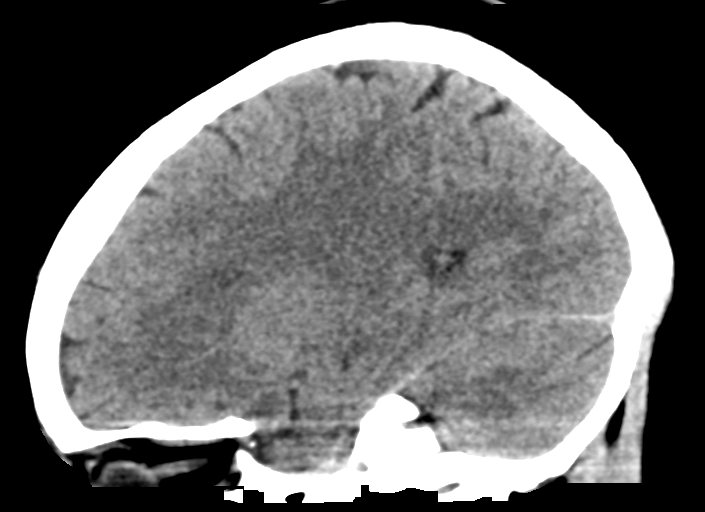

[15 of 45 positions shown; findings below may reference images not displayed]

FINDINGS: Brain: Cerebral volume within normal limits for patient age.

No evidence for acute intracranial hemorrhage. No findings to
suggest acute large vessel territory infarct. No mass lesion,
midline shift, or mass effect. Ventricles are normal in size without
evidence for hydrocephalus. No extra-axial fluid collection
identified.

Vascular: No hyperdense vessel identified.

Skull: Scalp soft tissues demonstrate no acute abnormality.
Calvarium intact.

Sinuses/Orbits: Globes and orbital soft tissues within normal
limits.

Mild scattered mucosal thickening within the ethmoidal air cells.
Visualized paranasal sinuses are otherwise clear. No mastoid
effusion. Middle ear cavities are clear.
IMPRESSION: Normal head CT.  No acute intracranial abnormality identified.

## 2020-09-11 ENCOUNTER — Other Ambulatory Visit (HOSPITAL_COMMUNITY)
Admission: RE | Admit: 2020-09-11 | Discharge: 2020-09-11 | Disposition: A | Discharge status: Home / Self Care | Payer: Medicaid Other | Source: Ambulatory Visit | Attending: Obstetrics and Gynecology | Admitting: Obstetrics and Gynecology

## 2020-09-11 ENCOUNTER — Encounter: Payer: Self-pay | Admitting: Obstetrics and Gynecology

## 2020-09-11 ENCOUNTER — Encounter: Payer: Medicaid Other | Admitting: Obstetrics and Gynecology

## 2020-09-11 ENCOUNTER — Ambulatory Visit (INDEPENDENT_AMBULATORY_CARE_PROVIDER_SITE_OTHER): Payer: Medicaid Other | Admitting: Obstetrics and Gynecology

## 2020-09-11 ENCOUNTER — Other Ambulatory Visit: Payer: Self-pay

## 2020-09-11 VITALS — BP 112/77 | HR 94 | Ht 70.0 in | Wt 182.4 lb

## 2020-09-11 DIAGNOSIS — Z124 Encounter for screening for malignant neoplasm of cervix: Secondary | ICD-10-CM | POA: Insufficient documentation

## 2020-09-11 DIAGNOSIS — R102 Pelvic and perineal pain: Secondary | ICD-10-CM

## 2020-09-11 DIAGNOSIS — Z9889 Other specified postprocedural states: Secondary | ICD-10-CM | POA: Diagnosis not present

## 2020-09-11 DIAGNOSIS — Z3009 Encounter for other general counseling and advice on contraception: Secondary | ICD-10-CM

## 2020-09-11 LAB — POCT URINALYSIS DIPSTICK
Bilirubin, UA: NEGATIVE
Blood, UA: NEGATIVE
Glucose, UA: NEGATIVE
Ketones, UA: NEGATIVE
Leukocytes, UA: NEGATIVE
Nitrite, UA: NEGATIVE
Protein, UA: NEGATIVE
Spec Grav, UA: 1.02 (ref 1.010–1.025)
Urobilinogen, UA: 0.2 E.U./dL
pH, UA: 6.5 (ref 5.0–8.0)

## 2020-09-11 NOTE — Progress Notes (Signed)
Pt present for abd pain. Pt c/o lower abd pain x 1 week.

## 2020-09-11 NOTE — Patient Instructions (Signed)
Pelvic Pain, Female Pelvic pain is pain in your lower abdomen, below your belly button and between your hips. The pain may start suddenly (be acute), keep coming back (be recurring), or last a long time (become chronic). Pelvic pain that lasts longer than 6 months is considered chronic. Pelvic pain may affect your:  Reproductive organs.  Urinary system.  Digestive tract.  Musculoskeletal system. There are many potential causes of pelvic pain. Sometimes, the pain can be a result of digestive or urinary conditions, strained muscles or ligaments, or reproductive conditions. Sometimes the cause of pelvic pain is not known. Follow these instructions at home:  Take over-the-counter and prescription medicines only as told by your health care provider.  Rest as told by your health care provider.  Do not have sex if it hurts.  Keep a journal of your pelvic pain. Write down: ? When the pain started. ? Where the pain is located. ? What seems to make the pain better or worse, such as food or your period (menstrual cycle). ? Any symptoms you have along with the pain.  Keep all follow-up visits as told by your health care provider. This is important.   Contact a health care provider if:  Medicine does not help your pain.  Your pain comes back.  You have new symptoms.  You have abnormal vaginal discharge or bleeding, including bleeding after menopause.  You have a fever or chills.  You are constipated.  You have blood in your urine or stool.  You have foul-smelling urine.  You feel weak or light-headed. Get help right away if:  You have sudden severe pain.  Your pain gets steadily worse.  You have severe pain along with fever, nausea, vomiting, or excessive sweating.  You lose consciousness. Summary  Pelvic pain is pain in your lower abdomen, below your belly button and between your hips.  There are many potential causes of pelvic pain.  Keep a journal of your pelvic  pain. This information is not intended to replace advice given to you by your health care provider. Make sure you discuss any questions you have with your health care provider. Document Revised: 01/20/2018 Document Reviewed: 01/20/2018 Elsevier Patient Education  2021 [employer]  

## 2020-09-11 NOTE — Progress Notes (Signed)
    GYNECOLOGY PROGRESS NOTE  Subjective:    Patient ID: Dorothy Young, female    DOB: September 30, 1990, 29 y.o.   MRN: [id number]  HPI  Patient is a 30 y.o. [id number] female who presents for complaints of lower abdominal pain x 1 week. Pain is described as mostly pressure but sometimes sharp. She also reports that sex was uncomfortable recently. She denies urinary or bowel abnormalities. Shw also denies vaginal discharge or bleeding. Patient's last menstrual period was 08/24/2020. She is not currently on any form of contraception.    The following portions of the patient's history were reviewed and updated as appropriate: allergies, current medications, past family history, past medical history, past social history, past surgical history and problem list.  Review of Systems Pertinent items noted in HPI and remainder of comprehensive ROS otherwise negative.   Objective:   Blood pressure 112/77, pulse 94, height 5\' 10"  (1.778 m), weight 182 lb 6.4 oz (82.7 kg), last menstrual period 08/24/2020. General appearance: alert and no distress Abdomen: soft, non-tender; bowel sounds normal; no masses,  no organomegaly Pelvic: external genitalia normal, rectovaginal septum normal.  Vagina with scant thin white discharge, no odor.  Cervix normal appearing, no lesions and positive for motion tenderness.  Uterus mobile, nontender, normal shape and size.  Adnexae non-palpable, nontender bilaterally.  Extremities: extremities normal, atraumatic, no cyanosis or edema Neurologic: Grossly normal     Labs:  Results for orders placed or performed in visit on 09/11/20  POCT urinalysis dipstick  Result Value Ref Range   Color, UA yellow    Clarity, UA clear    Glucose, UA Negative Negative   Bilirubin, UA neg    Ketones, UA neg    Spec Grav, UA 1.020 1.010 - 1.025   Blood, UA neg    pH, UA 6.5 5.0 - 8.0   Protein, UA Negative Negative   Urobilinogen, UA 0.2 0.2 or 1.0 E.U./dL   Nitrite, UA neg     Leukocytes, UA Negative Negative   Appearance yellow;clear    Odor      Assessment:   Pelvic pain Contraception counseling History of LEEP  Plan:   - Pelvic pain of unknown cause.  Nuswab performed due to cervical tenderness. Ruled out UTI. Encourage use of OTC pain relievers as necessary. If pain persists, can consider pelvic ultrasound.  - Contraception counseling: Reviewed all forms of birth control options available including abstinence; fertility period awareness methods; over the counter/barrier methods; hormonal contraceptive medication including pill, patch, ring, injection,contraceptive implant; hormonal and nonhormonal IUDs; permanent sterilization options including vasectomy and the various tubal sterilization modalities. Risks and benefits reviewed. Patient had desired to receive an IUD several months back, but missed appointment and never got around to rescheduling. Is considering IUD again. RTC in 3 weeks for f/u of symptoms and IUD insertion.  - H/o abnormal pap smear, s/p LEEP in 05/2019. Was due for q 6 month paps for 1 year.  Patient had last pap in 12/21/2019.  For repeat today. If normal, can return to routine screening.     02/20/2020, MD [facility]

## 2020-09-13 ENCOUNTER — Encounter: Payer: Self-pay | Admitting: Obstetrics and Gynecology

## 2020-09-13 LAB — CERVICOVAGINAL ANCILLARY ONLY
Bacterial Vaginitis (gardnerella): POSITIVE — AB
Candida Glabrata: NEGATIVE
Candida Vaginitis: NEGATIVE
Chlamydia: NEGATIVE
Comment: NEGATIVE
Comment: NEGATIVE
Comment: NEGATIVE
Comment: NEGATIVE
Comment: NEGATIVE
Comment: NORMAL
Neisseria Gonorrhea: NEGATIVE
Trichomonas: NEGATIVE

## 2020-09-13 LAB — CYTOLOGY - PAP: Diagnosis: NEGATIVE

## 2020-09-14 ENCOUNTER — Other Ambulatory Visit: Payer: Self-pay | Admitting: Obstetrics and Gynecology

## 2020-09-14 MED ORDER — METRONIDAZOLE 500 MG PO TABS: 500.0000 mg | ORAL_TABLET | Freq: Two times a day (BID) | ORAL | 0 refills | Status: DC

## 2020-09-18 ENCOUNTER — Other Ambulatory Visit: Payer: Self-pay

## 2020-09-18 ENCOUNTER — Ambulatory Visit (INDEPENDENT_AMBULATORY_CARE_PROVIDER_SITE_OTHER): Payer: Medicaid Other | Admitting: Obstetrics and Gynecology

## 2020-09-18 ENCOUNTER — Encounter: Payer: Self-pay | Admitting: Obstetrics and Gynecology

## 2020-09-18 VITALS — BP 129/82 | HR 84 | Ht 70.0 in | Wt 182.3 lb

## 2020-09-18 DIAGNOSIS — Z30430 Encounter for insertion of intrauterine contraceptive device: Secondary | ICD-10-CM

## 2020-09-18 LAB — POCT URINE PREGNANCY: Preg Test, Ur: NEGATIVE

## 2020-09-18 NOTE — Patient Instructions (Signed)

## 2020-09-18 NOTE — Progress Notes (Signed)
Pt present for IUD insertion. Pt stated that she was doing well. UPT-NEG.

## 2020-09-18 NOTE — Progress Notes (Signed)
    GYNECOLOGY OFFICE PROCEDURE NOTE  Dorothy Young is a 30 y.o. [id number] here for Mirena IUD insertion. No GYN concerns.  Last pap smear was on 09/11/2020 and was normal.  IUD Insertion Procedure Note Patient identified, informed consent performed, consent signed.   Discussed risks of irregular bleeding, cramping, infection, malpositioning or misplacement of the IUD outside the uterus which may require further procedure such as laparoscopy. Also discussed >99% contraception efficacy, increased risk of ectopic pregnancy with failure of method.   Emphasized that this did not protect against STIs, condoms recommended during all sexual encounters. Urine pregnancy test negative.  Speculum placed in the vagina.  Cervix visualized.  Cleaned with Betadine x 2.  Grasped anteriorly with a single tooth tenaculum.  Uterus sounded to 8 cm.  Mirena IUD placed per manufacturer's recommendations.  Strings trimmed to 3 cm. Tenaculum was removed, good hemostasis noted.  Patient tolerated procedure well.   Patient was given post-procedure instructions.  She was advised to have backup contraception for one week.  Patient was also asked to check IUD strings periodically and follow up in 4 weeks for IUD check.   Lot: [id number] Exp: 09/2022   Hildred Laser, MD [facility]

## 2020-10-16 ENCOUNTER — Ambulatory Visit (INDEPENDENT_AMBULATORY_CARE_PROVIDER_SITE_OTHER): Payer: Medicaid Other | Admitting: Obstetrics and Gynecology

## 2020-10-16 ENCOUNTER — Other Ambulatory Visit: Payer: Self-pay

## 2020-10-16 ENCOUNTER — Encounter: Payer: Self-pay | Admitting: Obstetrics and Gynecology

## 2020-10-16 VITALS — BP 118/83 | HR 102 | Ht 70.0 in | Wt 178.9 lb

## 2020-10-16 DIAGNOSIS — Z30431 Encounter for routine checking of intrauterine contraceptive device: Secondary | ICD-10-CM | POA: Diagnosis not present

## 2020-10-16 NOTE — Progress Notes (Signed)
IUD string check-pt stated that she was doing well and denies any issues with her IUD.

## 2020-10-16 NOTE — Progress Notes (Signed)
    GYNECOLOGY OFFICE ENCOUNTER NOTE  History:  30 y.o. [id number] here today for today for IUD string check; Mirena  IUD was placed  09/18/2020. No complaints about the IUD, no concerning side effects.  The following portions of the patient's history were reviewed and updated as appropriate: allergies, current medications, past family history, past medical history, past social history, past surgical history and problem list. Last pap smear on 09/11/2020 was normal, negative HRHPV.  Review of Systems:  Pertinent items are noted in HPI.  Objective:  Physical Exam Blood pressure 118/83, pulse (!) 102, height 5\' 10"  (1.778 m), weight 178 lb 14.4 oz (81.1 kg). CONSTITUTIONAL: Well-developed, well-nourished female in no acute distress.  ABDOMEN: Soft, no distention noted.   PELVIC: Normal appearing external genitalia; normal appearing vaginal mucosa and cervix.  IUD strings visualized, about 2.5 cm in length outside cervix.  Extremities: extremities normal, atraumatic, no cyanosis or edema Neurologic: Grossly normal   Assessment & Plan:  Patient to keep IUD in place for up to seven years; can come in for removal if she desires pregnancy earlier or for any concerning side effects.   , MD [facility]

## 2021-04-16 DIAGNOSIS — M546 Pain in thoracic spine: Secondary | ICD-10-CM | POA: Diagnosis not present

## 2021-04-16 DIAGNOSIS — Z13220 Encounter for screening for lipoid disorders: Secondary | ICD-10-CM | POA: Diagnosis not present

## 2021-04-16 DIAGNOSIS — Z862 Personal history of diseases of the blood and blood-forming organs and certain disorders involving the immune mechanism: Secondary | ICD-10-CM | POA: Diagnosis not present

## 2021-04-16 DIAGNOSIS — R5383 Other fatigue: Secondary | ICD-10-CM | POA: Diagnosis not present

## 2021-04-16 DIAGNOSIS — Z131 Encounter for screening for diabetes mellitus: Secondary | ICD-10-CM | POA: Diagnosis not present

## 2021-04-17 DIAGNOSIS — M546 Pain in thoracic spine: Secondary | ICD-10-CM | POA: Diagnosis not present

## 2021-04-30 DIAGNOSIS — M546 Pain in thoracic spine: Secondary | ICD-10-CM | POA: Diagnosis not present

## 2021-04-30 DIAGNOSIS — Z6827 Body mass index (BMI) 27.0-27.9, adult: Secondary | ICD-10-CM | POA: Diagnosis not present

## 2021-09-11 ENCOUNTER — Ambulatory Visit: Payer: Medicaid Other | Admitting: Obstetrics and Gynecology

## 2021-09-11 ENCOUNTER — Encounter: Payer: Self-pay | Admitting: Obstetrics and Gynecology

## 2021-09-11 ENCOUNTER — Other Ambulatory Visit: Payer: Self-pay

## 2021-09-11 ENCOUNTER — Other Ambulatory Visit (HOSPITAL_COMMUNITY)
Admission: RE | Admit: 2021-09-11 | Discharge: 2021-09-11 | Disposition: A | Discharge status: Home / Self Care | Payer: Medicaid Other | Source: Ambulatory Visit | Attending: Obstetrics and Gynecology | Admitting: Obstetrics and Gynecology

## 2021-09-11 VITALS — BP 124/80 | HR 86 | Ht 70.0 in | Wt 190.0 lb

## 2021-09-11 DIAGNOSIS — R102 Pelvic and perineal pain: Secondary | ICD-10-CM | POA: Diagnosis not present

## 2021-09-11 DIAGNOSIS — Z975 Presence of (intrauterine) contraceptive device: Secondary | ICD-10-CM | POA: Diagnosis not present

## 2021-09-11 DIAGNOSIS — N760 Acute vaginitis: Secondary | ICD-10-CM

## 2021-09-11 MED ORDER — FLUCONAZOLE 150 MG PO TABS: 150.0000 mg | ORAL_TABLET | Freq: Once | ORAL | 3 refills | Status: AC

## 2021-09-11 NOTE — Progress Notes (Signed)
° ° °  GYNECOLOGY PROGRESS NOTE  Subjective:    Patient ID: Dorothy Young, female    DOB: 09-May-1991, 31 y.o.   MRN: [id number]  HPI  Patient is a 31 y.o. [id number] female who presents for concerns regarding her current birth control (IUD, inserted 09/18/2020).  Is noting pelvic pain x 1 month. Occasionally takes OTC pain meds for relief. Notes cycles are regular, but very light, lasts 1-2 days then spotting.   Also complaining of vaginal discharge for the past 2 weeks. Thought it was a yeast infection, took OTC Monistat 1 which made symptoms worse. Does note an odor.    The following portions of the patient's history were reviewed and updated as appropriate: allergies, current medications, past family history, past medical history, past social history, past surgical history, and problem list.  Review of Systems Pertinent items noted in HPI and remainder of comprehensive ROS otherwise negative.   Objective:    Body mass index is 27.26 kg/m. General appearance: alert and no distress Abdomen: soft, non-tender; bowel sounds normal; no masses,  no organomegaly Pelvic: external genitalia normal, rectovaginal septum normal.  Vagina yellow-gray discharge, slightly curdish.  Cervix normal appearing, no lesions and no motion tenderness.  IUD threads visualized, 3 cm in length. Uterus mobile, nontender, normal shape and size.  Adnexae non-palpable, nontender bilaterally.  Extremities: extremities normal, atraumatic, no cyanosis or edema Neurologic: Grossly normal   Assessment:   1. Acute vaginitis   2. Pelvic pain   3. IUD (intrauterine device) in place      Plan:   Will treat empirically for yeast and BV infection.  Given sample of Nuvessa in office, and will pescribe Diflucan.  Vaginal culture performed If no improvement in symptoms after treatment of vaginitis, will return for further evaluation of pelvic pain. IUD appears to be in place on today's exam.    Hildred Laser,  MD [facility]

## 2021-09-11 NOTE — Patient Instructions (Signed)

## 2021-09-12 LAB — CERVICOVAGINAL ANCILLARY ONLY
Bacterial Vaginitis (gardnerella): POSITIVE — AB
Candida Glabrata: NEGATIVE
Candida Vaginitis: POSITIVE — AB
Chlamydia: NEGATIVE
Comment: NEGATIVE
Comment: NEGATIVE
Comment: NEGATIVE
Comment: NEGATIVE
Comment: NEGATIVE
Comment: NORMAL
Neisseria Gonorrhea: NEGATIVE
Trichomonas: NEGATIVE

## 2021-10-17 NOTE — Patient Instructions (Signed)
Breast Self-Awareness °Breast self-awareness is knowing how your breasts look and feel. Doing breast self-awareness is important. It allows you to catch a breast problem early while it is still small and can be treated. All women should do breast self-awareness, including women who have had breast implants. Tell your doctor if you notice a change in your breasts. °What you need: °A mirror. °A well-lit room. °How to do a breast self-exam °A breast self-exam is one way to learn what is normal for your breasts and to check for changes. To do a breast self-exam: °Look for changes ° °Take off all the clothes above your waist. °Stand in front of a mirror in a room with good lighting. °Put your hands on your hips. °Push your hands down. °Look at your breasts and nipples in the mirror to see if one breast or nipple looks different from the other. Check to see if: °The shape of one breast is different. °The size of one breast is different. °There are wrinkles, dips, and bumps in one breast and not the other. °Look at each breast for changes in the skin, such as: °Redness. °Scaly areas. °Look for changes in your nipples, such as: °Liquid around the nipples. °Bleeding. °Dimpling. °Redness. °A change in where the nipples are. °Feel for changes ° °Lie on your back on the floor. °Feel each breast. To do this, follow these steps: °Pick a breast to feel. °Put the arm closest to that breast above your head. °Use your other arm to feel the nipple area of your breast. Feel the area with the pads of your three middle fingers by making small circles with your fingers. For the first circle, press lightly. For the second circle, press harder. For the third circle, press even harder. °Keep making circles with your fingers at the different pressures as you move down your breast. Stop when you feel your ribs. °Move your fingers a little toward the center of your body. °Start making circles with your fingers again, this time going up until  you reach your collarbone. °Keep making up-and-down circles until you reach your armpit. Remember to keep using the three pressures. °Feel the other breast in the same way. °Sit or stand in the tub or shower. °With soapy water on your skin, feel each breast the same way you did in step 2 when you were lying on the floor. °Write down what you find °Writing down what you find can help you remember what to tell your doctor. Write down: °What is normal for each breast. °Any changes you find in each breast, including: °The kind of changes you find. °Whether you have pain. °Size and location of any lumps. °When you last had your menstrual period. °General tips °Check your breasts every month. °If you are breastfeeding, the best time to check your breasts is after you feed your baby or after you use a breast pump. °If you get menstrual periods, the best time to check your breasts is 5-7 days after your menstrual period is over. °With time, you will become comfortable with the self-exam, and you will begin to know if there are changes in your breasts. °Contact a doctor if you: °See a change in the shape or size of your breasts or nipples. °See a change in the skin of your breast or nipples, such as red or scaly skin. °Have fluid coming from your nipples that is not normal. °Find a lump or thick area that was not there before. °Have pain in   your breasts. °Have any concerns about your breast health. °Summary °Breast self-awareness includes looking for changes in your breasts, as well as feeling for changes within your breasts. °Breast self-awareness should be done in front of a mirror in a well-lit room. °You should check your breasts every month. If you get menstrual periods, the best time to check your breasts is 5-7 days after your menstrual period is over. °Let your doctor know of any changes you see in your breasts, including changes in size, changes on the skin, pain or tenderness, or fluid from your nipples that is not  normal. °This information is not intended to replace advice given to you by your health care provider. Make sure you discuss any questions you have with your health care provider. °Document Revised: 03/23/2018 Document Reviewed: 03/23/2018 °Elsevier Patient Education © 2022 Else[employer] Inc. °Preventive Care 21-39 Years Old, Female °Preventive care refers to lifestyle choices and visits with your health care provider that can promote health and wellness. Preventive care visits are also called wellness exams. °What can I expect for my preventive care visit? °Counseling °During your preventive care visit, your health care provider may ask about your: °Medical history, including: °Past medical problems. °Family medical history. °Pregnancy history. °Current health, including: °Menstrual cycle. °Method of birth control. °Emotional well-being. °Home life and relationship well-being. °Sexual activity and sexual health. °Lifestyle, including: °Alcohol, nicotine or tobacco, and drug use. °Access to firearms. °Diet, exercise, and sleep habits. °Work and work environment. °Sunscreen use. °Safety issues such as seatbelt and bike helmet use. °Physical exam °Your health care provider may check your: °Height and weight. These may be used to calculate your BMI (body mass index). BMI is a measurement that tells if you are at a healthy weight. °Waist circumference. This measures the distance around your waistline. This measurement also tells if you are at a healthy weight and may help predict your risk of certain diseases, such as type 2 diabetes and high blood pressure. °Heart rate and blood pressure. °Body temperature. °Skin for abnormal spots. °What immunizations do I need? °Vaccines are usually given at various ages, according to a schedule. Your health care provider will recommend vaccines for you based on your age, medical history, and lifestyle or other factors, such as travel or where you work. °What tests do I  need? °Screening °Your health care provider may recommend screening tests for certain conditions. This may include: °Pelvic exam and Pap test. °Lipid and cholesterol levels. °Diabetes screening. This is done by checking your blood sugar (glucose) after you have not eaten for a while (fasting). °Hepatitis B test. °Hepatitis C test. °HIV (human immunodeficiency virus) test. °STI (sexually transmitted infection) testing, if you are at risk. °BRCA-related cancer screening. This may be done if you have a family history of breast, ovarian, tubal, or peritoneal cancers. °Talk with your health care provider about your test results, treatment options, and if necessary, the need for more tests. °Follow these instructions at home: °Eating and drinking ° °Eat a healthy diet that includes fresh fruits and vegetables, whole grains, lean protein, and low-fat dairy products. °Take vitamin and mineral supplements as recommended by your health care provider. °Do not drink alcohol if: °Your health care provider tells you not to drink. °You are pregnant, may be pregnant, or are planning to become pregnant. °If you drink alcohol: °Limit how much you have to 0-1 drink a day. °Know how much alcohol is in your drink. In the [place] one drink equals one 12 oz   bottle of beer (355 mL), one 5 oz glass of wine (148 mL), or one 1 oz glass of hard liquor (44 mL). Lifestyle Brush your teeth every morning and night with fluoride toothpaste. Floss one time each day. Exercise for at least 30 minutes 5 or more days each week. Do not use any products that contain nicotine or tobacco. These products include cigarettes, chewing tobacco, and vaping devices, such as e-cigarettes. If you need help quitting, ask your health care provider. Do not use drugs. If you are sexually active, practice safe sex. Use a condom or other form of protection to prevent STIs. If you do not wish to become pregnant, use a form of birth control. If you plan to become  pregnant, see your health care provider for a prepregnancy visit. Find healthy ways to manage stress, such as: Meditation, yoga, or listening to music. Journaling. Talking to a trusted person. Spending time with friends and family. Minimize exposure to UV radiation to reduce your risk of skin cancer. Safety Always wear your seat belt while driving or riding in a vehicle. Do not drive: If you have been drinking alcohol. Do not ride with someone who has been drinking. If you have been using any mind-altering substances or drugs. While texting. When you are tired or distracted. Wear a helmet and other protective equipment during sports activities. If you have firearms in your house, make sure you follow all gun safety procedures. Seek help if you have been physically or sexually abused. What's next? Go to your health care provider once a year for an annual wellness visit. Ask your health care provider how often you should have your eyes and teeth checked. Stay up to date on all vaccines. This information is not intended to replace advice given to you by your health care provider. Make sure you discuss any questions you have with your health care provider. Document Revised: 01/30/2021 Document Reviewed: 01/30/2021 Elsevier Patient Education  [place].

## 2021-10-17 NOTE — Progress Notes (Signed)
GYNECOLOGY ANNUAL PHYSICAL EXAM PROGRESS NOTE  Subjective:    Dorothy Young is a 31 y.o. [id number] female who presents for an annual exam. The patient has no complaints today. The patient is sexually active. The patient participates in regular exercise: no. Has the patient ever been transfused or tattooed?: no. The patient reports that there is not domestic violence in her life.    Menstrual History: Menarche age: 62 No LMP recorded. (Menstrual status: IUD).     Gynecologic History:  Contraception: IUD History of STI's: Chlamydia 11 years ago. Last Pap: 09/11/2020. Results were: normal. Notes h/o abnormal pap smears. (04/01/2017: HGSIL pap smear. 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  04/22/2019: LGSIL, with few cells suggestive of a higher grade lesion.).  S/p LEEP in 2020.  Last mammogram: Not age appropriate      OB History  Gravida Para Term Preterm AB Living  7 4 4  0 3 4  SAB IAB Ectopic Multiple Live Births  1 1 0 0 4    # Outcome Date GA Lbr Len/2nd Weight Sex Delivery Anes PTL Lv  7 IAB 09/2019          6 Term 10/01/17 [redacted]w[redacted]d  7 lb 12.9 oz (3.54 kg) F Vag-Spont None  LIV     Name: Dorothy Young     Apgar1: 9  Apgar5: 9  5 SAB 06/08/16 [redacted]w[redacted]d         4 Term 08/30/14 [redacted]w[redacted]d 01:44 / 00:23 6 lb 13 oz (3.09 kg) M Vag-Spont None  LIV     Apgar1: 8  Apgar5: 9  3 AB 2012 [redacted]w[redacted]d    TAB     2 Term 07/22/10 [redacted]w[redacted]d  6 lb 8 oz (2.948 kg) F Vag-Spont None N LIV  1 Term 09/15/08 [redacted]w[redacted]d  6 lb 7 oz (2.92 kg) F Vag-Spont None N LIV    Past Medical History:  Diagnosis Date   Anemia    thalassemia Beta minor   H/O chlamydia infection 2011   Pregnancy    Thalassemia, beta (HCC) 05/18/2014   Worsening headaches     Past Surgical History:  Procedure Laterality Date   CERVICAL BIOPSY  W/ LOOP ELECTRODE EXCISION     LEEP N/A 05/30/2019   Procedure: LOOP ELECTROSURGICAL EXCISION PROCEDURE (LEEP);  Surgeon: 07/30/2019, MD;  Location: [facility]  [facility];  Service: Gynecology;  Laterality: N/A;   NO PAST SURGERIES      Family History  Problem Relation Age of Onset   Healthy Mother    Healthy Father    Healthy Brother    Healthy Brother    Healthy Brother    Healthy Brother    Healthy Sister    Diabetes Paternal Grandmother    Cancer Paternal Aunt        unknown cancer    Social History   Socioeconomic History   Marital status: Single    Spouse name: Not on file   Number of children: 2   Years of education: 11   Highest education level: Not on file  Occupational History    Employer: UNEMPLOYED  Tobacco Use   Smoking status: Never   Smokeless tobacco: Never  Vaping Use   Vaping Use: Never used  Substance and Sexual Activity   Alcohol use: Yes    Comment: occasional   Drug use: No   Sexual activity: Yes    Birth control/protection: I.U.D.  Other Topics Concern   Not on  file  Social History Narrative   Patient is single and lives with her mother.   Patient has two children.   Patient is not currently not working.   Patient has a high school education.   Patient is right-handed.   Patient drinks very little soda- not everyday.   Social Determinants of Health   Financial Resource Strain: Not on file  Food Insecurity: Not on file  Transportation Needs: Not on file  Physical Activity: Not on file  Stress: Not on file  Social Connections: Not on file  Intimate Partner Violence: Not on file    Current Outpatient Medications on File Prior to Visit  Medication Sig Dispense Refill   acetaminophen (TYLENOL) 500 MG tablet Take 1,000 mg by mouth every 6 (six) hours as needed (pain). (Patient not taking: Reported on 09/11/2021)     ibuprofen (ADVIL) 800 MG tablet Take 1 tablet (800 mg total) by mouth every 8 (eight) hours as needed. (Patient not taking: Reported on 09/11/2021) 60 tablet 0   No current facility-administered medications on file prior to visit.    No Known Allergies   Review of  Systems Constitutional: negative for chills, fatigue, fevers and sweats Eyes: negative for irritation, redness and visual disturbance Ears, nose, mouth, throat, and face: negative for hearing loss, nasal congestion, snoring and tinnitus Respiratory: negative for asthma, cough, sputum Cardiovascular: negative for chest pain, dyspnea, exertional chest pressure/discomfort, irregular heart beat, palpitations and syncope Gastrointestinal: negative for abdominal pain, change in bowel habits, nausea and vomiting Genitourinary: negative for abnormal menstrual periods, genital lesions, sexual problems and vaginal discharge, dysuria and urinary incontinence Integument/breast: negative for breast lump, breast tenderness and nipple discharge Hematologic/lymphatic: negative for bleeding and easy bruising Musculoskeletal:negative for back pain and muscle weakness Neurological: negative for dizziness, headaches, vertigo and weakness Endocrine: negative for diabetic symptoms including polydipsia, polyuria and skin dryness Allergic/Immunologic: negative for hay fever and urticaria      Objective:  Blood pressure (!) 106/91, pulse 85, resp. rate 16, height 5\' 10"  (1.778 m), weight 193 lb 9.6 oz (87.8 kg), last menstrual period 10/15/2021.  Body mass index is 27.78 kg/m.    General Appearance:    Alert, cooperative, no distress, appears stated age  Head:    Normocephalic, without obvious abnormality, atraumatic  Eyes:    PERRL, conjunctiva/corneas clear, EOM's intact, both eyes  Ears:    Normal external ear canals, both ears  Nose:   Nares normal, septum midline, mucosa normal, no drainage or sinus tenderness  Throat:   Lips, mucosa, and tongue normal; teeth and gums normal  Neck:   Supple, symmetrical, trachea midline, no adenopathy; thyroid: no enlargement/tenderness/nodules; no carotid bruit or JVD  Back:     Symmetric, no curvature, ROM normal, no CVA tenderness  Lungs:     Clear to auscultation  bilaterally, respirations unlabored  Chest Wall:    No tenderness or deformity   Heart:    Regular rate and rhythm, S1 and S2 normal, no murmur, rub or gallop  Breast Exam:    No tenderness, masses, or nipple abnormality  Abdomen:     Soft, non-tender, bowel sounds active all four quadrants, no masses, no organomegaly.    Genitalia:    Pelvic:external genitalia normal, vagina without lesions, discharge, or tenderness, rectovaginal septum  normal. Cervix normal in appearance, no cervical motion tenderness, no adnexal masses or tenderness.  Uterus normal size, shape, mobile, regular contours, nontender.  Rectal:    Normal external sphincter.  No hemorrhoids  appreciated. Internal exam not done.   Extremities:   Extremities normal, atraumatic, no cyanosis or edema  Pulses:   2+ and symmetric all extremities  Skin:   Skin color, texture, turgor normal, no rashes or lesions  Lymph nodes:   Cervical, supraclavicular, and axillary nodes normal  Neurologic:   CNII-XII intact, normal strength, sensation and reflexes throughout   .  Labs:  Lab Results  Component Value Date   WBC 4.6 05/26/2019   HGB 11.4 (L) 05/26/2019   HCT 35.9 (L) 05/26/2019   MCV 79.8 (L) 05/26/2019   PLT 172 05/26/2019    Lab Results  Component Value Date   CREATININE 0.59 08/20/2018   BUN 8 08/20/2018   NA 136 08/20/2018   K 3.3 (L) 08/20/2018   CL 105 08/20/2018   CO2 21 (L) 08/20/2018    Lab Results  Component Value Date   ALT 16 08/20/2018   AST 15 08/20/2018   ALKPHOS 52 08/20/2018   BILITOT 0.9 08/20/2018    Lab Results  Component Value Date   TSH 1.020 11/11/2017     Assessment:   1. Encounter for well woman exam with routine gynecological exam   2. IUD (intrauterine device) in place   3. History of cervical dysplasia   4. Subclinical hyperthyroidism      Plan:  Blood tests: CBC with diff, Comprehensive metabolic panel, TSH, and Hemoglobin A1c. Breast self exam technique reviewed and  patient encouraged to perform self-exam monthly. Contraception: IUD.  Discussed healthy lifestyle modifications. Mammogram  Not age appropriate Pap smear  UTD . Has h/o cervical dysplasia s/p LEEP in 2020.  Continue routine surveillance.  COVID vaccination status: Declined Subclinical hyperthyroidism, TSH ordered today.  Follow up in 1 year for annual exam     Hildred Laser, MD [facility]

## 2021-10-18 ENCOUNTER — Other Ambulatory Visit: Payer: Self-pay

## 2021-10-18 ENCOUNTER — Encounter: Payer: Self-pay | Admitting: Obstetrics and Gynecology

## 2021-10-18 ENCOUNTER — Ambulatory Visit (INDEPENDENT_AMBULATORY_CARE_PROVIDER_SITE_OTHER): Payer: Medicaid Other | Admitting: Obstetrics and Gynecology

## 2021-10-18 VITALS — BP 106/91 | HR 85 | Resp 16 | Ht 70.0 in | Wt 193.6 lb

## 2021-10-18 DIAGNOSIS — Z01419 Encounter for gynecological examination (general) (routine) without abnormal findings: Secondary | ICD-10-CM | POA: Diagnosis not present

## 2021-10-18 DIAGNOSIS — E0590 Thyrotoxicosis, unspecified without thyrotoxic crisis or storm: Secondary | ICD-10-CM | POA: Diagnosis not present

## 2021-10-18 DIAGNOSIS — Z87410 Personal history of cervical dysplasia: Secondary | ICD-10-CM | POA: Diagnosis not present

## 2021-10-18 DIAGNOSIS — Z975 Presence of (intrauterine) contraceptive device: Secondary | ICD-10-CM | POA: Diagnosis not present

## 2021-10-19 LAB — COMPREHENSIVE METABOLIC PANEL
ALT: 20 IU/L (ref 0–32)
AST: 17 IU/L (ref 0–40)
Albumin/Globulin Ratio: 1.6 (ref 1.2–2.2)
Albumin: 4.9 g/dL (ref 3.9–5.0)
Alkaline Phosphatase: 67 IU/L (ref 44–121)
BUN/Creatinine Ratio: 12 (ref 9–23)
BUN: 9 mg/dL (ref 6–20)
Bilirubin Total: 0.4 mg/dL (ref 0.0–1.2)
CO2: 22 mmol/L (ref 20–29)
Calcium: 9.7 mg/dL (ref 8.7–10.2)
Chloride: 101 mmol/L (ref 96–106)
Creatinine, Ser: 0.74 mg/dL (ref 0.57–1.00)
Globulin, Total: 3.1 g/dL (ref 1.5–4.5)
Glucose: 90 mg/dL (ref 70–99)
Potassium: 4.2 mmol/L (ref 3.5–5.2)
Sodium: 138 mmol/L (ref 134–144)
Total Protein: 8 g/dL (ref 6.0–8.5)
eGFR: 112 mL/min/{1.73_m2} (ref >59)

## 2021-10-19 LAB — CBC
Hematocrit: 39.9 % (ref 34.0–46.6)
Hemoglobin: 13.3 g/dL (ref 11.1–15.9)
MCH: 25.8 pg — ABNORMAL LOW (ref 26.6–33.0)
MCHC: 33.3 g/dL (ref 31.5–35.7)
MCV: 77 fL — ABNORMAL LOW (ref 79–97)
Platelets: 218 10*3/uL (ref 150–450)
RBC: 5.16 x10E6/uL (ref 3.77–5.28)
RDW: 15.7 % — ABNORMAL HIGH (ref 11.7–15.4)
WBC: 4.7 10*3/uL (ref 3.4–10.8)

## 2021-10-19 LAB — HEMOGLOBIN A1C
Est. average glucose Bld gHb Est-mCnc: 97 mg/dL
Hgb A1c MFr Bld: 5 % (ref 4.8–5.6)

## 2021-10-19 LAB — TSH: TSH: 0.41 u[IU]/mL — ABNORMAL LOW (ref 0.450–4.500)

## 2021-10-28 DIAGNOSIS — R7989 Other specified abnormal findings of blood chemistry: Secondary | ICD-10-CM | POA: Diagnosis not present

## 2021-10-28 DIAGNOSIS — M545 Low back pain, unspecified: Secondary | ICD-10-CM | POA: Diagnosis not present

## 2021-10-28 DIAGNOSIS — Z6827 Body mass index (BMI) 27.0-27.9, adult: Secondary | ICD-10-CM | POA: Diagnosis not present

## 2021-11-21 ENCOUNTER — Ambulatory Visit
Admission: RE | Admit: 2021-11-21 | Discharge: 2021-11-21 | Disposition: A | Discharge status: Home / Self Care | Payer: Medicaid Other | Source: Ambulatory Visit | Attending: Internal Medicine | Admitting: Internal Medicine

## 2021-11-21 VITALS — BP 125/78 | HR 83 | Temp 99.2°F | Resp 18

## 2021-11-21 DIAGNOSIS — J209 Acute bronchitis, unspecified: Secondary | ICD-10-CM

## 2021-11-21 MED ORDER — BENZONATATE 200 MG PO CAPS: 200.0000 mg | ORAL_CAPSULE | Freq: Two times a day (BID) | ORAL | 0 refills | Status: DC | PRN

## 2021-11-21 MED ORDER — GUAIFENESIN-CODEINE 100-10 MG/5ML PO SOLN: 10.0000 mL | Freq: Every day | ORAL | 0 refills | Status: DC

## 2021-11-21 MED ORDER — ALBUTEROL SULFATE HFA 108 (90 BASE) MCG/ACT IN AERS: 2.0000 | INHALATION_SPRAY | RESPIRATORY_TRACT | 0 refills | Status: AC | PRN

## 2021-11-21 MED ORDER — AZITHROMYCIN 250 MG PO TABS: ORAL_TABLET | ORAL | 0 refills | Status: DC

## 2021-11-21 NOTE — ED Provider Notes (Signed)
?[facility] ? ? ? ?CSN: [id number] ?Arrival date & time: 11/21/21  1610 ? ? ?  ? ?History   ?Chief Complaint ?Chief Complaint  ?Patient presents with  ? Cough  ?  APPT 1615  ? ? ?HPI ?Dorothy Young is a 31 y.o. female who presents with onset of  URI 2 weeks ago, and got better for except the cough and has gotten worse in the past week. Has been having cough attacks and makes her vomits. On occasion gets productive yellow mucous from coughing. Denies smoking or having asthma/ Denies fever or sweats.  ? ? ? ?Past Medical History:  ?Diagnosis Date  ? Anemia   ? thalassemia Beta minor  ? H/O chlamydia infection 2011  ? Pregnancy   ? Thalassemia, beta ([place]) 05/18/2014  ? Worsening headaches   ? ? ?Patient Active Problem List  ? Diagnosis Date Noted  ? Premature uterine contractions, antepartum, third trimester 10/01/2017  ? Subclinical hyperthyroidism 09/02/2017  ? CIN III (cervical intraepithelial neoplasia grade III) with severe dysplasia 03/04/2017  ? H/O miscarriage, currently pregnant, first trimester 03/04/2017  ? History of chlamydia 06/05/2016  ? History of abnormal cervical Pap smear 06/05/2016  ? Goiter 06/05/2016  ? 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 ? ?Past Surgical History:  ?Procedure Laterality Date  ? CERVICAL BIOPSY  W/ LOOP ELECTRODE EXCISION    ? LEEP N/A 05/30/2019  ? Procedure: LOOP ELECTROSURGICAL EXCISION PROCEDURE (LEEP);  Surgeon: Rubie Maid, MD;  Location: [facility] ORS;  Service: Gynecology;  Laterality: N/A;  ? NO PAST SURGERIES    ? ? ?OB History   ? ? Gravida  ?7  ? Para  ?4  ? Term  ?4  ? Preterm  ?   ? AB  ?3  ? Living  ?4  ?  ? ? SAB  ?1  ? IAB  ?1  ? Ectopic  ?   ? Multiple  ?0  ? Live Births  ?4  ?   ?  ?  ? ? ? ?Home Medications   ? ?Prior to Admission medications   ?Medication Sig Start Date End Date Taking? Authorizing Provider  ?albuterol (VENTOLIN HFA) 108 (90 Base) MCG/ACT inhaler Inhale 2 puffs into the lungs every 4 (four) hours as needed for wheezing or shortness  of breath. Prn cough attacks 11/21/21  Yes Rodriguez-Southworth, Sunday Spillers, PA-C  ?azithromycin (ZITHROMAX Z-PAK) 250 MG tablet 2 today, then one qd x 4 days 11/21/21  Yes Rodriguez-Southworth, Sunday Spillers, PA-C  ?benzonatate (TESSALON) 200 MG capsule Take 1 capsule (200 mg total) by mouth 2 (two) times daily as needed for cough. 11/21/21  Yes Rodriguez-Southworth, Sunday Spillers, PA-C  ?guaiFENesin-codeine 100-10 MG/5ML syrup Take 10 mLs by mouth at bedtime. 11/21/21  Yes Rodriguez-Southworth, Sunday Spillers, PA-C  ? ? ?Family History ?Family History  ?Problem Relation Age of Onset  ? Healthy Mother   ? Healthy Father   ? Healthy Brother   ? Healthy Brother   ? Healthy Brother   ? Healthy Brother   ? Healthy Sister   ? Diabetes Paternal Grandmother   ? Cancer Paternal Aunt   ?     unknown cancer  ? ? ?Social History ?Social History  ? ?Tobacco Use  ? Smoking status: Never  ? Smokeless tobacco: Never  ?Vaping Use  ? Vaping Use: Never used  ?Substance Use Topics  ? Alcohol use: Yes  ?  Comment: occasional  ? Drug use: No  ? ? ? ?Allergies   ?Patient has  no known allergies. ? ? ?Review of Systems ?Review of Systems  ?Constitutional:  Positive for appetite change and fatigue. Negative for chills, diaphoresis and fever.  ?HENT:  Positive for postnasal drip. Negative for congestion, ear discharge, ear pain and sore throat.   ?Respiratory:  Positive for cough. Negative for chest tightness, shortness of breath and wheezing.   ?Musculoskeletal:  Negative for myalgias.  ?Neurological:  Negative for headaches.  ? ? ?Physical Exam ?Triage Vital Signs ?ED Triage Vitals  ?Enc Vitals Group  ?   BP 11/21/21 1626 125/78  ?   Pulse Rate 11/21/21 1626 83  ?   Resp 11/21/21 1626 18  ?   Temp 11/21/21 1626 99.2 ?F (37.3 ?C)  ?   Temp Source 11/21/21 1626 Oral  ?   SpO2 11/21/21 1626 98 %  ?   Weight --   ?   Height --   ?   Head Circumference --   ?   Peak Flow --   ?   Pain Score 11/21/21 1623 0  ?   Pain Loc --   ?   Pain Edu? --   ?   Excl. in [place]? --   ? ?No data  found. ? ?Updated Vital Signs ?BP 125/78 (BP Location: Left Arm)   Pulse 83   Temp 99.2 ?F (37.3 ?C) (Oral)   Resp 18   LMP 10/19/2021 (Exact Date)   SpO2 98%  ? ?Visual Acuity ?Right Eye Distance:   ?Left Eye Distance:   ?Bilateral Distance:   ? ?Right Eye Near:   ?Left Eye Near:    ?Bilateral Near:    ? ?Physical Exam ?Physical Exam ?Vitals signs and nursing note reviewed.  ?Constitutional:   ?   General: She is not in acute distress. ?   Appearance: Normal appearance. She is not ill-appearing, toxic-appearing or diaphoretic.  ?HENT:  ?   Head: Normocephalic.  ?   Right Ear: Tympanic membrane, ear canal and external ear normal.  ?   Left Ear: Tympanic membrane, ear canal and external ear normal.  ?   Nose: Nose normal.  ?   Mouth/Throat:  ?   Mouth: Mucous membranes are moist.  ?Eyes:  ?   General: No scleral icterus.    ?   Right eye: No discharge.     ?   Left eye: No discharge.  ?   Conjunctiva/sclera: Conjunctivae normal.  ?Neck:  ?   Musculoskeletal: Neck supple. No neck rigidity.  ?Cardiovascular:  ?   Rate and Rhythm: Normal rate and regular rhythm.  ?   Heart sounds: No murmur.  ?Pulmonary: has a wheezy sounding cough ?   Effort: Pulmonary effort is normal.  ?   Breath sounds: Normal breath sounds.  ? ?Musculoskeletal: Normal range of motion.  ?Lymphadenopathy:  ?   Cervical: No cervical adenopathy.  ?Skin: ?   General: Skin is warm and dry.  ?   Coloration: Skin is not jaundiced.  ?   Findings: No rash.  ?Neurological:  ?   Mental Status: She is alert and oriented to person, place, and time.  ?   Gait: Gait normal.  ?Psychiatric:     ?   Mood and Affect: Mood normal.     ?   Behavior: Behavior normal.     ?   Thought Content: Thought content normal.     ?   Judgment: Judgment normal.  ? ? ? ?UC Treatments / Results  ?Labs ?(all labs  ordered are listed, but only abnormal results are displayed) ?Labs Reviewed - No data to display ? ?EKG ? ? ?Radiology ?No results found. ? ?Procedures ?Procedures  (including critical care time) ? ?Medications Ordered in UC ?Medications - No data to display ? ?Initial Impression / Assessment and Plan / UC Course  ?I have reviewed the triage vital signs and the nursing notes. ?Bronchitis ?I placed her on Albuterol inhaler, Zpack, Tessalon and Rob AC qhs as noted.  ?Final Clinical Impressions(s) / UC Diagnoses  ? ?Final diagnoses:  ?Acute bronchitis, unspecified organism  ? ?Discharge Instructions   ?None ?  ? ?ED Prescriptions   ? ? Medication Sig Dispense Auth. Provider  ? guaiFENesin-codeine 100-10 MG/5ML syrup Take 10 mLs by mouth at bedtime. 120 mL Rodriguez-Southworth, Sunday Spillers, PA-C  ? azithromycin (ZITHROMAX Z-PAK) 250 MG tablet 2 today, then one qd x 4 days 6 tablet Rodriguez-Southworth, Sunday Spillers, PA-C  ? benzonatate (TESSALON) 200 MG capsule Take 1 capsule (200 mg total) by mouth 2 (two) times daily as needed for cough. 30 capsule Rodriguez-Southworth, Sunday Spillers, PA-C  ? albuterol (VENTOLIN HFA) 108 (90 Base) MCG/ACT inhaler Inhale 2 puffs into the lungs every 4 (four) hours as needed for wheezing or shortness of breath. Prn cough attacks 16 g Rodriguez-Southworth, Sunday Spillers, PA-C  ? ?  ? ?PDMP not reviewed this encounter. ?  ?Shelby Mattocks, PA-C ?11/21/21 1652 ? ?

## 2021-11-21 NOTE — ED Triage Notes (Signed)
Pt presents with worsening cough x 1 week not relieved by OTC cough/congestion meds.  Reports chest congestion.  Difficulty sleeping.  Sometimes productive for yellow mucous.  No fevers.   ?

## 2022-05-07 ENCOUNTER — Ambulatory Visit (INDEPENDENT_AMBULATORY_CARE_PROVIDER_SITE_OTHER): Payer: Medicaid Other | Admitting: Obstetrics and Gynecology

## 2022-05-07 ENCOUNTER — Other Ambulatory Visit (HOSPITAL_COMMUNITY)
Admission: RE | Admit: 2022-05-07 | Discharge: 2022-05-07 | Disposition: A | Discharge status: Home / Self Care | Payer: Medicaid Other | Source: Ambulatory Visit | Attending: Obstetrics and Gynecology | Admitting: Obstetrics and Gynecology

## 2022-05-07 ENCOUNTER — Encounter: Payer: Self-pay | Admitting: Obstetrics and Gynecology

## 2022-05-07 VITALS — BP 111/77 | HR 83 | Ht 70.0 in | Wt 184.0 lb

## 2022-05-07 DIAGNOSIS — N760 Acute vaginitis: Secondary | ICD-10-CM | POA: Insufficient documentation

## 2022-05-07 DIAGNOSIS — R102 Pelvic and perineal pain: Secondary | ICD-10-CM | POA: Diagnosis not present

## 2022-05-07 NOTE — Progress Notes (Signed)
    GYNECOLOGY PROGRESS NOTE  Subjective:    Patient ID: Dorothy Young, female    DOB: February 26, 1991, 31 y.o.   MRN: [id number]  HPI  Patient is a 31 y.o. [id number] female who presents for pelvic pain x 1 week as well as a white creamy discharge, no odor or itching/burning. Wonders if the pain is coming from her IUD.  The following portions of the patient's history were reviewed and updated as appropriate: allergies, current medications, past family history, past medical history, past social history, past surgical history, and problem list.  Review of Systems Pertinent items are noted in HPI.   Objective:   Blood pressure 111/77, pulse 83, height 5\' 10"  (1.778 m), weight 184 lb (83.5 kg), last menstrual period 04/18/2022.  Body mass index is 26.4 kg/m. General appearance: alert, cooperative, and no distress Abdomen: soft, non-tender; bowel sounds normal; no masses,  no organomegaly Pelvic: external genitalia normal, rectovaginal septum normal.  Vagina with moderate amount of white homogenous discharge.  Cervix normal appearing, no lesions and no motion tenderness. IUD threads visible, ~ 1.5 cm in length. Uterus mobile, nontender, normal shape and size.  Adnexae non-palpable, nontender bilaterally.   Assessment:   1. Pelvic pain   2. Acute vaginitis     Plan:   Discussed that pelvic pain may likely be secondary to vaginal discharge (vaginitis).  Will perform vaginal culture. Patient ok to include STD screening with GC/CT on culture. If culture is negative, can proceed with further evaluation of IUD with ultrasound.    Rubie Maid, MD [facility]

## 2022-05-09 ENCOUNTER — Other Ambulatory Visit: Payer: Self-pay | Admitting: Obstetrics and Gynecology

## 2022-05-09 LAB — CERVICOVAGINAL ANCILLARY ONLY
Bacterial Vaginitis (gardnerella): POSITIVE — AB
Candida Glabrata: NEGATIVE
Candida Vaginitis: POSITIVE — AB
Chlamydia: NEGATIVE
Comment: NEGATIVE
Comment: NEGATIVE
Comment: NEGATIVE
Comment: NEGATIVE
Comment: NEGATIVE
Comment: NORMAL
Neisseria Gonorrhea: NEGATIVE
Trichomonas: NEGATIVE

## 2022-05-09 MED ORDER — METRONIDAZOLE 500 MG PO TABS: 500.0000 mg | ORAL_TABLET | Freq: Two times a day (BID) | ORAL | 0 refills | Status: DC

## 2022-05-09 MED ORDER — FLUCONAZOLE 150 MG PO TABS: 150.0000 mg | ORAL_TABLET | Freq: Once | ORAL | 2 refills | Status: DC

## 2022-05-24 ENCOUNTER — Ambulatory Visit
Admission: RE | Admit: 2022-05-24 | Discharge: 2022-05-24 | Disposition: A | Discharge status: Home / Self Care | Payer: Medicaid Other | Source: Ambulatory Visit | Attending: Emergency Medicine | Admitting: Emergency Medicine

## 2022-05-24 VITALS — BP 132/87 | HR 90 | Temp 97.9°F | Resp 18

## 2022-05-24 DIAGNOSIS — H6121 Impacted cerumen, right ear: Secondary | ICD-10-CM

## 2022-05-24 DIAGNOSIS — H60501 Unspecified acute noninfective otitis externa, right ear: Secondary | ICD-10-CM

## 2022-05-24 MED ORDER — CIPROFLOXACIN-DEXAMETHASONE 0.3-0.1 % OT SUSP: 4.0000 [drp] | Freq: Two times a day (BID) | OTIC | 0 refills | Status: DC

## 2022-05-24 NOTE — ED Provider Notes (Signed)
Roderic Palau    CSN: [id number] Arrival date & time: 05/24/22  1114      History   Chief Complaint Chief Complaint  Patient presents with   Otalgia    HPI Dorothy Young is a 31 y.o. female.  Patient presents with 5-day history of right ear pain.  Treatment attempted with OTC eardrops.  No ear drainage, fever, chills, sore throat, cough, shortness of breath, or other symptoms.  The history is provided by the patient and medical records.    Past Medical History:  Diagnosis Date   Anemia    thalassemia Beta minor   H/O chlamydia infection 2011   Pregnancy    Thalassemia, beta ([place]) 05/18/2014   Worsening headaches     Patient Active Problem List   Diagnosis Date Noted   Premature uterine contractions, antepartum, third trimester 10/01/2017   Subclinical hyperthyroidism 09/02/2017   CIN III (cervical intraepithelial neoplasia grade III) with severe dysplasia 03/04/2017   H/O miscarriage, currently pregnant, first trimester 03/04/2017   History of chlamydia 06/05/2016   History of abnormal cervical Pap smear 06/05/2016   Goiter 06/05/2016   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 Authorizing Provider  ciprofloxacin-dexamethasone (CIPRODEX) OTIC suspension Place 4 drops into the right ear 2 (two) times daily. 05/24/22  Yes Sharion Balloon, NP  albuterol (VENTOLIN HFA) 108 (90 Base) MCG/ACT  inhaler Inhale 2 puffs into the lungs every 4 (four) hours as needed for wheezing or shortness of breath. Prn cough attacks 11/21/21   Rodriguez-Southworth, Sunday Spillers, PA-C  azithromycin (ZITHROMAX Z-PAK) 250 MG tablet 2 today, then one qd x 4 days 11/21/21   Rodriguez-Southworth, Sunday Spillers, PA-C  benzonatate (TESSALON) 200 MG capsule Take 1 capsule (200 mg total) by mouth 2 (two) times daily as needed for cough. 11/21/21   Rodriguez-Southworth, Sunday Spillers, PA-C  guaiFENesin-codeine 100-10 MG/5ML syrup Take 10 mLs by mouth at bedtime. 11/21/21   Rodriguez-Southworth, Sunday Spillers, PA-C  metroNIDAZOLE (FLAGYL) 500 MG tablet Take 1 tablet (500 mg total) by mouth 2 (two) times daily. 05/09/22   Rubie Maid, MD    Family History Family History  Problem Relation Age of Onset   Healthy Mother    Healthy Father    Healthy Brother    Healthy Brother    Healthy Brother    Healthy Brother    Healthy Sister    Diabetes Paternal Grandmother    Cancer Paternal Aunt        unknown cancer    Social History Social History   Tobacco Use   Smoking status: Never   Smokeless tobacco: Never  Vaping Use  Vaping Use: Never used  Substance Use Topics   Alcohol use: Yes    Comment: occasional   Drug use: No     Allergies   Patient has no known allergies.   Review of Systems Review of Systems  Constitutional:  Negative for chills and fever.  HENT:  Positive for ear pain. Negative for ear discharge and sore throat.   Respiratory:  Negative for cough and shortness of breath.   Gastrointestinal:  Negative for diarrhea and vomiting.  Skin:  Negative for color change and rash.  All other systems reviewed and are negative.    Physical Exam Triage Vital Signs ED Triage Vitals  Enc Vitals Group     BP 05/24/22 1126 132/87     Pulse Rate 05/24/22 1126 90     Resp 05/24/22 1126 18     Temp 05/24/22 1126 97.9 F (36.6 C)     Temp src --      SpO2 05/24/22 1126 97 %     Weight --      Height --      Head  Circumference --      Peak Flow --      Pain Score 05/24/22 1130 7     Pain Loc --      Pain Edu? --      Excl. in [place]? --    No data found.  Updated Vital Signs BP 132/87   Pulse 90   Temp 97.9 F (36.6 C)   Resp 18   LMP 04/18/2022 (Approximate)   SpO2 97%   Visual Acuity Right Eye Distance:   Left Eye Distance:   Bilateral Distance:    Right Eye Near:   Left Eye Near:    Bilateral Near:     Physical Exam Vitals and nursing note reviewed.  Constitutional:      General: She is not in acute distress.    Appearance: Normal appearance. She is well-developed. She is not ill-appearing.  HENT:     Head: Normocephalic and atraumatic.     Right Ear: There is impacted cerumen.     Left Ear: Tympanic membrane and ear canal normal.     Nose: Nose normal.     Mouth/Throat:     Mouth: Mucous membranes are moist.     Pharynx: Oropharynx is clear.  Cardiovascular:     Rate and Rhythm: Normal rate and regular rhythm.     Heart sounds: Normal heart sounds.  Pulmonary:     Effort: Pulmonary effort is normal. No respiratory distress.     Breath sounds: Normal breath sounds.  Musculoskeletal:     Cervical back: Neck supple.  Skin:    General: Skin is warm and dry.  Neurological:     Mental Status: She is alert.  Psychiatric:        Mood and Affect: Mood normal.        Behavior: Behavior normal.      UC Treatments / Results  Labs (all labs ordered are listed, but only abnormal results are displayed) Labs Reviewed - No data to display  EKG   Radiology No results found.  Procedures Procedures (including critical care time)  Medications Ordered in UC Medications - No data to display  Initial Impression / Assessment and Plan / UC Course  I have reviewed the triage vital signs and the nursing notes.  Pertinent labs & imaging results that were available during my care of the patient were reviewed by me and  considered in my medical decision making (see chart for  details).    Cerumen impaction of right ear, right otitis externa.  Cerumen removed via irrigation by RN.  Her TM is noted to be clear but the canal is erythematous.  Treating with Ciprodex eardrops.  Instructed patient to follow up with her PCP if her symptoms are not improving.  She agrees to plan of care.    Final Clinical Impressions(s) / UC Diagnoses   Final diagnoses:  Impacted cerumen of right ear  Acute otitis externa of right ear, unspecified type     Discharge Instructions      The earwax was removed from your ear by irrigation today.  Use the antibiotic eardrops as directed.  Follow up with your primary care provider if your symptoms are not improving.        ED Prescriptions     Medication Sig Dispense Auth. Provider   ciprofloxacin-dexamethasone (CIPRODEX) OTIC suspension Place 4 drops into the right ear 2 (two) times daily. 7.5 mL Mickie Bail, NP      PDMP not reviewed this encounter.   Mickie Bail, NP 05/24/22 [phone]

## 2022-05-24 NOTE — Discharge Instructions (Addendum)
The earwax was removed from your ear by irrigation today.  Use the antibiotic eardrops as directed.  Follow up with your primary care provider if your symptoms are not improving.

## 2022-05-24 NOTE — ED Triage Notes (Signed)
Pt reports right ear pain since Monday. States has tried an OTC ear ache medication with no relief.

## 2022-06-11 ENCOUNTER — Encounter: Payer: Self-pay | Admitting: Obstetrics and Gynecology

## 2022-06-11 ENCOUNTER — Other Ambulatory Visit (HOSPITAL_COMMUNITY)
Admission: RE | Admit: 2022-06-11 | Discharge: 2022-06-11 | Disposition: A | Discharge status: Home / Self Care | Payer: Medicaid Other | Source: Ambulatory Visit | Attending: Obstetrics and Gynecology | Admitting: Obstetrics and Gynecology

## 2022-06-11 ENCOUNTER — Ambulatory Visit: Payer: Medicaid Other | Admitting: Obstetrics and Gynecology

## 2022-06-11 VITALS — BP 108/67 | HR 98 | Resp 16 | Ht 70.0 in | Wt 180.2 lb

## 2022-06-11 DIAGNOSIS — N898 Other specified noninflammatory disorders of vagina: Secondary | ICD-10-CM | POA: Diagnosis not present

## 2022-06-11 DIAGNOSIS — B3731 Acute candidiasis of vulva and vagina: Secondary | ICD-10-CM | POA: Diagnosis not present

## 2022-06-11 MED ORDER — TERCONAZOLE 0.4 % VA CREA: 1.0000 | TOPICAL_CREAM | Freq: Every day | VAGINAL | 0 refills | Status: DC

## 2022-06-11 NOTE — Patient Instructions (Signed)

## 2022-06-11 NOTE — Progress Notes (Signed)
    GYNECOLOGY PROGRESS NOTE  Subjective:    Patient ID: Dorothy Young, female    DOB: May 03, 1991, 31 y.o.   MRN: [id number]  HPI  Patient is a 31 y.o. [id number] female who presents for evaluation of vaginal swelling and irritation.  Notes that this has been going on for several days. Also noting moderate amount of white discharge, no odor present. Denies concerns for STD exposure. Tried a leftover Diflucan tablet but notes that this did not help.   The following portions of the patient's history were reviewed and updated as appropriate: allergies, current medications, past family history, past medical history, past social history, past surgical history, and problem list.  Review of Systems Pertinent items noted in HPI and remainder of comprehensive ROS otherwise negative.   Objective:   Blood pressure 108/67, pulse 98, resp. rate 16, height 5\' 10"  (1.778 m), weight 180 lb 3.2 oz (81.7 kg), last menstrual period 05/07/2022.  Body mass index is 25.86 kg/m. General appearance: alert and no distress Abdomen: soft, non-tender; bowel sounds normal; no masses,  no organomegaly Pelvic: external genitalia normal, rectovaginal septum normal.  Vagina with moderate thick white cottage cheese-like discharge, no odor.  Cervix normal appearing, no lesions and no motion tenderness.  Bimanual exam not performed.     Assessment:   1. Yeast vaginitis      Plan:   1. Yeast vaginitis - Cervicovaginal ancillary only - Will treat for suspected yeast infection. Prescribed Terazol 7 day cream as initial does of Diflucan did not help symptoms.  - Return to clinic for any scheduled appointments or for any gynecologic concerns as needed.     Rubie Maid, MD [place]

## 2022-06-13 ENCOUNTER — Other Ambulatory Visit: Payer: Self-pay | Admitting: Obstetrics and Gynecology

## 2022-06-13 LAB — CERVICOVAGINAL ANCILLARY ONLY
Bacterial Vaginitis (gardnerella): POSITIVE — AB
Candida Glabrata: NEGATIVE
Candida Vaginitis: POSITIVE — AB
Comment: NEGATIVE
Comment: NEGATIVE
Comment: NEGATIVE

## 2022-06-13 MED ORDER — METRONIDAZOLE 500 MG PO TABS: 500.0000 mg | ORAL_TABLET | Freq: Two times a day (BID) | ORAL | 0 refills | Status: DC

## 2022-07-24 ENCOUNTER — Ambulatory Visit: Payer: Medicaid Other | Admitting: Obstetrics and Gynecology

## 2022-07-24 ENCOUNTER — Encounter: Payer: Self-pay | Admitting: Obstetrics and Gynecology

## 2022-07-24 VITALS — BP 126/85 | HR 92 | Resp 16 | Ht 71.0 in | Wt 180.0 lb

## 2022-07-24 DIAGNOSIS — Z30432 Encounter for removal of intrauterine contraceptive device: Secondary | ICD-10-CM | POA: Diagnosis not present

## 2022-07-24 NOTE — Progress Notes (Signed)
    GYNECOLOGY OFFICE PROCEDURE NOTE  Dorothy Young is a 31 y.o. [phone] here for Liletta IUD removal. Reports intermittent pelvic pains over the past year since the IUD was inserted, is occurring more frequently.  Denies abnormal bleeding. Last pap smear was on 09/11/2020 and was normal.  IUD Removal  Patient identified, informed consent performed, consent signed.  Patient was in the dorsal lithotomy position, normal external genitalia was noted.  A speculum was placed in the patient's vagina, normal discharge was noted, no lesions. The cervix was visualized, no lesions, no abnormal discharge.  The strings of the IUD were grasped and pulled using Kelly forceps. The IUD was removed in its entirety.  Patient tolerated the procedure well.    Patient will use undecided method for contraception, given handout on all available options.  Routine preventative health maintenance measures emphasized.    Hildred Laser, MD [facility]

## 2022-07-28 ENCOUNTER — Encounter: Payer: Self-pay | Admitting: Obstetrics and Gynecology

## 2022-07-29 ENCOUNTER — Ambulatory Visit
Admission: EM | Admit: 2022-07-29 | Discharge: 2022-07-29 | Disposition: A | Discharge status: Home / Self Care | Payer: Medicaid Other | Attending: Emergency Medicine | Admitting: Emergency Medicine

## 2022-07-29 DIAGNOSIS — H109 Unspecified conjunctivitis: Secondary | ICD-10-CM

## 2022-07-29 DIAGNOSIS — Z1152 Encounter for screening for COVID-19: Secondary | ICD-10-CM | POA: Diagnosis not present

## 2022-07-29 DIAGNOSIS — J069 Acute upper respiratory infection, unspecified: Secondary | ICD-10-CM

## 2022-07-29 DIAGNOSIS — R051 Acute cough: Secondary | ICD-10-CM

## 2022-07-29 DIAGNOSIS — R059 Cough, unspecified: Secondary | ICD-10-CM | POA: Insufficient documentation

## 2022-07-29 LAB — RESP PANEL BY RT-PCR (FLU A&B, COVID) ARPGX2
Influenza A by PCR: NEGATIVE
Influenza B by PCR: NEGATIVE
SARS Coronavirus 2 by RT PCR: NEGATIVE

## 2022-07-29 MED ORDER — POLYMYXIN B-TRIMETHOPRIM 10000-0.1 UNIT/ML-% OP SOLN: 1.0000 [drp] | Freq: Four times a day (QID) | OPHTHALMIC | 0 refills | Status: AC

## 2022-07-29 NOTE — Discharge Instructions (Addendum)
Your COVID and Flu tests are pending.    Take Tylenol or ibuprofen as needed for fever or discomfort.  Rest and keep yourself hydrated.    Use the eye drops as directed.  Follow-up with your primary care provider if your symptoms are not improving.

## 2022-07-29 NOTE — ED Provider Notes (Signed)
Dorothy Young    CSN: [id number] Arrival date & time: 07/29/22  0818      History   Chief Complaint Chief Complaint  Patient presents with   Eye Problem    Entered by patient    HPI Dorothy Young is a 31 y.o. female.  Patient presents with 4-day history of bodyaches, sore throat, cough.  No OTC medications taken today; took Mucinex last night.  She reports left eye discomfort, yellow drainage, crusting in lashes this morning.  She denies fever, chills, rash, shortness of breath, vomiting, diarrhea, or other symptoms.  No eye injury or change in vision.   The history is provided by the patient and medical records.    Past Medical History:  Diagnosis Date   Anemia    thalassemia Beta minor   H/O chlamydia infection 2011   Pregnancy    Thalassemia, beta (HCC) 05/18/2014   Worsening headaches     Patient Active Problem List   Diagnosis Date Noted   Cough 07/29/2022   Premature uterine contractions, antepartum, third trimester 10/01/2017   Subclinical hyperthyroidism 09/02/2017   CIN III (cervical intraepithelial neoplasia grade III) with severe dysplasia 03/04/2017   H/O miscarriage, currently pregnant, first trimester 03/04/2017   History of chlamydia 06/05/2016   History of abnormal cervical Pap smear 06/05/2016   Goiter 06/05/2016   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  Authorizing Provider  trimethoprim-polymyxin b (POLYTRIM) ophthalmic solution Place 1 drop into both eyes 4 (four) times daily for 7 days. 07/29/22 08/05/22 Yes Mickie Bail, NP  albuterol (VENTOLIN HFA) 108 (90 Base) MCG/ACT inhaler Inhale 2 puffs into the lungs every 4 (four) hours as needed for wheezing or shortness of breath. Prn cough attacks 11/21/21   Rodriguez-Southworth, Nettie Elm, PA-C    Family History Family History  Problem Relation Age of Onset   Healthy Mother    Healthy Father    Healthy Brother    Healthy Brother    Healthy Brother    Healthy Brother    Healthy Sister    Diabetes Paternal Grandmother    Cancer Paternal Aunt        unknown cancer    Social History Social History   Tobacco Use   Smoking status: Never   Smokeless tobacco: Never  Vaping Use   Vaping Use: Never used  Substance Use Topics   Alcohol use: Yes    Comment: occasional   Drug use: No     Allergies   Patient has no known allergies.   Review of Systems Review of Systems  Constitutional:  Negative for chills and fever.  HENT:  Positive for sore throat. Negative for ear pain.   Eyes:  Positive for discharge, redness and itching. Negative for pain and visual disturbance.  Respiratory:  Positive for cough. Negative for shortness of breath.   Cardiovascular:  Negative for chest pain and palpitations.  Gastrointestinal:  Negative for abdominal pain, diarrhea and vomiting.  Skin:  Negative for color change and rash.  All other systems reviewed and are negative.    Physical Exam Triage Vital Signs ED Triage Vitals  Enc Vitals Group     BP 07/29/22 0847 126/83     Pulse Rate 07/29/22 0847 92     Resp 07/29/22 0847 16     Temp 07/29/22 0847 98.6 F (37 C)     Temp src --      SpO2 07/29/22 0847 98 %     Weight 07/29/22 0846 180 lb (81.6 kg)     Height 07/29/22 0846 5\' 11"  (1.803 m)     Head Circumference --      Peak Flow --      Pain Score 07/29/22 0843 6     Pain Loc --       Pain Edu? --      Excl. in GC? --    No data found.  Updated Vital Signs BP 126/83   Pulse 92   Temp 98.6 F (37 C)   Resp 16   Ht 5\' 11"  (1.803 m)   Wt 180 lb (81.6 kg)   LMP 07/17/2022   SpO2 98%   BMI 25.10 kg/m   Visual Acuity Right Eye Distance: 20/16 Left Eye Distance: 20/16 Bilateral Distance: 20/16  Right Eye Near:   Left Eye Near:    Bilateral Near:     Physical Exam Vitals and nursing note reviewed.  Constitutional:      General: She is not in acute distress.    Appearance: Normal appearance. She is well-developed. She is not ill-appearing.  HENT:     Right Ear: Tympanic membrane normal.     Left Ear: Tympanic membrane normal.     Nose: Nose normal.     Mouth/Throat:     Mouth: Mucous membranes are moist.     Pharynx: Oropharynx is clear.  Eyes:     General: Lids are normal. Vision grossly intact.     Extraocular Movements: Extraocular movements intact.     Conjunctiva/sclera:     Right eye: Right conjunctiva is injected.     Left eye: Left conjunctiva is injected.     Pupils: Pupils are equal, round, and reactive to light.  Cardiovascular:     Rate and Rhythm: Normal rate and regular rhythm.     Heart sounds: Normal heart sounds.  Pulmonary:     Effort: Pulmonary effort is normal. No respiratory distress.     Breath sounds: Normal breath sounds.  Musculoskeletal:     Cervical back: Neck supple.  Skin:    General: Skin is warm and dry.  Neurological:     Mental Status: She is alert.  Psychiatric:        Mood and Affect: Mood normal.        Behavior: Behavior normal.      UC Treatments / Results  Labs (all labs ordered are listed, but only abnormal results are displayed) Labs Reviewed  RESP PANEL BY RT-PCR (FLU A&B, COVID) [id number]    EKG   Radiology No results found.  Procedures Procedures (including critical care time)  Medications Ordered in  UC Medications - No data to display  Initial Impression / Assessment and Plan / UC  Course  I have reviewed the triage vital signs and the nursing notes.  Pertinent labs & imaging results that were available during my care of the patient were reviewed by me and considered in my medical decision making (see chart for details).   Cough, viral URI, bilateral conjunctivitis.  Conjunctivitis with Polytrim eyedrops.  COVID and Flu pending.  Discussed symptomatic treatment including Tylenol or ibuprofen, rest, hydration.  Instructed patient to follow up with PCP if symptoms are not improving.  She agrees to plan of care.    Final Clinical Impressions(s) / UC Diagnoses   Final diagnoses:  Acute cough  Viral URI  Conjunctivitis of both eyes, unspecified conjunctivitis type     Discharge Instructions      Your COVID and Flu tests are pending.    Take Tylenol or ibuprofen as needed for fever or discomfort.  Rest and keep yourself hydrated.    Use the eye drops as directed.  Follow-up with your primary care provider if your symptoms are not improving.         ED Prescriptions     Medication Sig Dispense Auth. Provider   trimethoprim-polymyxin b (POLYTRIM) ophthalmic solution Place 1 drop into both eyes 4 (four) times daily for 7 days. 10 mL Mickie Bail, NP      PDMP not reviewed this encounter.   Mickie Bail, NP 07/29/22 [phone]

## 2022-07-29 NOTE — ED Triage Notes (Addendum)
Patient to Urgent Care with complaints of sore throat/ cough/ generalized body aches since Friday intermittently felt better then worse.   Reports she woke up this morning and her left eye was painful and crusted shut with yellow discharge.  Denies any known fevers.

## 2022-08-25 ENCOUNTER — Other Ambulatory Visit: Payer: Self-pay | Admitting: Obstetrics and Gynecology

## 2022-12-18 NOTE — Progress Notes (Signed)
    GYNECOLOGY PROGRESS NOTE  Subjective:    Patient ID: Dorothy Young, female    DOB: [birth year], 32 y.o.   MRN: [id number]  HPI  Patient is a 32 y.o. [id number] female who presents for evaluation of "uterine pain" for ~ 2 weeks.  Notes pain feels like pressure at times like it is swollen, and other times more sharp pain. Also has some back pain.  Had intercourse once where she felt like her partner was hitting something internally.  Denies any abnormal vaginal discharge or bleeding. Reports normal bowel movements and denies urinary symptoms.    The following portions of the patient's history were reviewed and updated as appropriate: allergies, current medications, past family history, past medical history, past social history, past surgical history, and problem list.  Review of Systems Pertinent items noted in HPI and remainder of comprehensive ROS otherwise negative.   Objective:   Blood pressure 115/70, pulse 85, weight 180 lb (81.6 kg), last menstrual period 12/04/2022. Body mass index is 25.1 kg/m. General appearance: alert and no distress Abdomen: soft, mildly tender in RLQ; bowel sounds normal; no masses,  no organomegaly Pelvic: external genitalia normal, rectovaginal septum normal.  Vagina moderate thick clumpy white discharge.  Cervix normal appearing, no lesions and no motion tenderness.  Uterus mobile, nontender, normal shape and size.  Adnexae non-palpable, tender on right side.  Extremities: extremities normal, atraumatic, no cyanosis or edema Neurologic: Grossly normal   Assessment:   1. Pelvic pain   2. Yeast vaginitis      Plan:   1. Pelvic pain - Unclear cause, could be due to vaginitis/cervicitis, or ovarian cyst. - US PELVIC COMPLETE WITH TRANSVAGINAL; Future - Cervicovaginal ancillary only  2. Yeast vaginitis - Prescribed Diflucan. Patient with last yeast infection 4 months ago.  If patient continues to experience recurrent yeast infections, may need  further workup to discover underlying cause.    Hildred Laser, MD  [facility]

## 2022-12-19 ENCOUNTER — Other Ambulatory Visit (HOSPITAL_COMMUNITY)
Admission: RE | Admit: 2022-12-19 | Discharge: 2022-12-19 | Disposition: A | Discharge status: Home / Self Care | Payer: Medicaid Other | Source: Ambulatory Visit | Attending: Obstetrics and Gynecology | Admitting: Obstetrics and Gynecology

## 2022-12-19 ENCOUNTER — Encounter: Payer: Self-pay | Admitting: Obstetrics and Gynecology

## 2022-12-19 ENCOUNTER — Ambulatory Visit: Payer: Medicaid Other | Admitting: Obstetrics and Gynecology

## 2022-12-19 VITALS — BP 115/70 | HR 85 | Wt 180.0 lb

## 2022-12-19 DIAGNOSIS — B3731 Acute candidiasis of vulva and vagina: Secondary | ICD-10-CM

## 2022-12-19 DIAGNOSIS — R102 Pelvic and perineal pain: Secondary | ICD-10-CM

## 2022-12-19 MED ORDER — FLUCONAZOLE 150 MG PO TABS: ORAL_TABLET | ORAL | 2 refills | Status: DC

## 2022-12-19 NOTE — Patient Instructions (Signed)
Pelvic Pain, Female Pelvic pain is pain in your lower abdomen, below your belly button and between your hips. The pain may start suddenly (be acute), keep coming back (be recurring), or last a long time (become chronic). Pelvic pain that lasts longer than 6 months is considered chronic. Pelvic pain may affect your: Reproductive organs. Urinary system. Digestive tract. Musculoskeletal system. There are many potential causes of pelvic pain. Sometimes, the pain can be a result of digestive or urinary conditions, strained muscles or ligaments, or reproductive conditions. Sometimes the cause of pelvic pain is not known. Follow these instructions at home:  Take over-the-counter and prescription medicines only as told by your health care provider. Rest as told by your health care provider. Do not have sex if it hurts. Keep a journal of your pelvic pain. Write down: When the pain started. Where the pain is located. What seems to make the pain better or worse, such as food or your monthly period (menstrual cycle). Any symptoms you have along with the pain. Keep all follow-up visits. This is important. Contact a health care provider if: Medicine does not help your pain, or your pain comes back. You have new symptoms. You have abnormal vaginal discharge or bleeding, including bleeding after menopause. You have a fever or chills. You are constipated. You have blood in your urine or stool (feces). You have foul-smelling urine. You feel weak or light-headed. Get help right away if: You have sudden severe pain. Your pain gets steadily worse. You have severe pain along with fever, nausea, vomiting, or excessive sweating. You lose consciousness. These symptoms may represent a serious problem that is an emergency. Do not wait to see if the symptoms will go away. Get medical help right away. Call your local emergency services (911 in the [place] Do not drive yourself to the hospital. Summary Pelvic  pain is pain in your lower abdomen, below your belly button and between your hips. There are many potential causes of pelvic pain. Keep a journal of your pelvic pain. This information is not intended to replace advice given to you by your health care provider. Make sure you discuss any questions you have with your health care provider. Document Revised: 12/11/2020 Document Reviewed: 12/11/2020 Elsevier Patient Education  2023 Elsevier Inc.  

## 2022-12-23 ENCOUNTER — Other Ambulatory Visit: Payer: Self-pay | Admitting: Obstetrics and Gynecology

## 2022-12-23 LAB — CERVICOVAGINAL ANCILLARY ONLY
Bacterial Vaginitis (gardnerella): POSITIVE — AB
Candida Glabrata: NEGATIVE
Candida Vaginitis: POSITIVE — AB
Chlamydia: NEGATIVE
Comment: NEGATIVE
Comment: NEGATIVE
Comment: NEGATIVE
Comment: NEGATIVE
Comment: NEGATIVE
Comment: NORMAL
Neisseria Gonorrhea: NEGATIVE
Trichomonas: NEGATIVE

## 2022-12-23 MED ORDER — NUVESSA 1.3 % VA GEL: 1.0000 | Freq: Once | VAGINAL | 0 refills | Status: AC

## 2022-12-29 ENCOUNTER — Ambulatory Visit (INDEPENDENT_AMBULATORY_CARE_PROVIDER_SITE_OTHER): Payer: Medicaid Other

## 2022-12-29 ENCOUNTER — Other Ambulatory Visit: Payer: Self-pay | Admitting: Obstetrics and Gynecology

## 2022-12-29 DIAGNOSIS — R102 Pelvic and perineal pain: Secondary | ICD-10-CM

## 2022-12-29 DIAGNOSIS — B3731 Acute candidiasis of vulva and vagina: Secondary | ICD-10-CM

## 2023-03-31 ENCOUNTER — Ambulatory Visit: Payer: Medicaid Other | Admitting: Obstetrics and Gynecology

## 2023-03-31 ENCOUNTER — Encounter: Payer: Self-pay | Admitting: Obstetrics and Gynecology

## 2023-03-31 ENCOUNTER — Other Ambulatory Visit (HOSPITAL_COMMUNITY)
Admission: RE | Admit: 2023-03-31 | Discharge: 2023-03-31 | Disposition: A | Discharge status: Home / Self Care | Payer: Medicaid Other | Source: Ambulatory Visit | Attending: Obstetrics and Gynecology | Admitting: Obstetrics and Gynecology

## 2023-03-31 VITALS — BP 123/79 | HR 85 | Ht 70.0 in | Wt 189.7 lb

## 2023-03-31 DIAGNOSIS — N760 Acute vaginitis: Secondary | ICD-10-CM | POA: Insufficient documentation

## 2023-03-31 MED ORDER — TERCONAZOLE 0.4 % VA CREA: 1.0000 | TOPICAL_CREAM | Freq: Every day | VAGINAL | 1 refills | Status: AC

## 2023-03-31 MED ORDER — FLUCONAZOLE 150 MG PO TABS: 150.0000 mg | ORAL_TABLET | ORAL | 0 refills | Status: DC

## 2023-03-31 NOTE — Progress Notes (Addendum)
    GYNECOLOGY PROGRESS NOTE  Subjective:    Patient ID: Dorothy Young, female    DOB: [birth year], 32 y.o.   MRN: [id number]  HPI  Patient is a 32 y.o. [id number] female who presents for evaluation for recurrent yeast infections. Has been dealing with this for the past year or more. Recently changed soaps to something more gentle (Aveeno). Denies increased intake of carbs and sugars.  Sexually active with same partner (however reports that he is uncircumcised). Was last treated 3 months ago.  Has tried OTC supplements (believes it was boric acid suppositories) but notes that it made her very irritated and worsened her symptoms.   The following portions of the patient's history were reviewed and updated as appropriate: allergies, current medications, past family history, past medical history, past social history, past surgical history, and problem list.  Review of Systems Pertinent items noted in HPI and remainder of comprehensive ROS otherwise negative.   Objective:   Blood pressure 123/79, pulse 85, height 5\' 10"  (1.778 m), weight 189 lb 11.2 oz (86 kg). Body mass index is 27.22 kg/m. General appearance: alert and no distress Abdomen: soft, non-tender; bowel sounds normal; no masses,  no organomegaly Pelvic: external genitalia normal, rectovaginal septum normal.  Vagina with moderate white  discharge with some clumping.  Cervix normal appearing, no lesions and no motion tenderness.  Uterus mobile, nontender, normal shape and size.  Adnexae non-palpable, nontender bilaterally.     Assessment:   1. Recurrent vaginitis      Plan:   1. Recurrent vaginitis - Discussed treatment for recurrent vaginitis with 6 months of therapy. Patient ok to utilize.  Will also screen for diabetes as patient with recurrent issues.  Discussed that it is possible that partner being uncircumcised can affect her, encouraged showering of both partners before sex, and wiping/voiding for her after sex. Vaginal  culture performed today.    Patient overdue for wellness exam.  Will schedule in the next 2-3 weeks.     Hildred Laser, MD [facility]

## 2023-04-02 ENCOUNTER — Other Ambulatory Visit: Payer: Self-pay | Admitting: Obstetrics and Gynecology

## 2023-04-02 MED ORDER — CLINDAMYCIN HCL 300 MG PO CAPS: 300.0000 mg | ORAL_CAPSULE | Freq: Two times a day (BID) | ORAL | 0 refills | Status: DC

## 2023-04-22 ENCOUNTER — Ambulatory Visit
Admission: RE | Admit: 2023-04-22 | Discharge: 2023-04-22 | Disposition: A | Discharge status: Home / Self Care | Payer: Medicaid Other | Source: Ambulatory Visit | Attending: Emergency Medicine | Admitting: Emergency Medicine

## 2023-04-22 VITALS — BP 128/79 | HR 96 | Temp 98.3°F | Resp 18

## 2023-04-22 DIAGNOSIS — L258 Unspecified contact dermatitis due to other agents: Secondary | ICD-10-CM

## 2023-04-22 MED ORDER — PREDNISONE 10 MG (21) PO TBPK: ORAL_TABLET | Freq: Every day | ORAL | 0 refills | Status: DC

## 2023-04-22 NOTE — ED Triage Notes (Signed)
Patient to Urgent Care with complaints of a rash present to her left arm and the left side of her stomach. Itchy and red.   Reports symptoms started four days ago. Denies any new product usage.

## 2023-04-22 NOTE — ED Provider Notes (Signed)
Dorothy Young    CSN: [id number] Arrival date & time: 04/22/23  1256      History   Chief Complaint Chief Complaint  Patient presents with   Rash    Arm and stomach - Entered by patient    HPI Dorothy Young is a 32 y.o. female.   32 year old female, Dorothy Young, presents to urgent care for evaluation of rash to left arm and stomach, itchy and red, works in [employer]. Pt states she used new shower gel.   The history is provided by the patient. No language interpreter was used.    Past Medical History:  Diagnosis Date   Anemia    thalassemia Beta minor   H/O chlamydia infection 2011   Pregnancy    Thalassemia, beta (HCC) 05/18/2014   Worsening headaches     Patient Active Problem List   Diagnosis Date Noted   Contact dermatitis due to soap 04/22/2023   Cough 07/29/2022   Premature uterine contractions, antepartum, third trimester 10/01/2017   Subclinical hyperthyroidism 09/02/2017   CIN III (cervical intraepithelial neoplasia grade III) with severe dysplasia 03/04/2017   H/O miscarriage, currently pregnant, first trimester 03/04/2017   History of chlamydia 06/05/2016   History of abnormal cervical Pap smear 06/05/2016   Goiter 06/05/2016   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 Authorizing Provider  clindamycin (CLEOCIN) 300 MG capsule Take 1 capsule (300 mg total) by mouth 2 (two)  times daily. For seven days 04/02/23   Hildred Laser, MD  albuterol (VENTOLIN HFA) 108 (90 Base) MCG/ACT inhaler Inhale 2 puffs into the lungs every 4 (four) hours as needed for wheezing or shortness of breath. Prn cough attacks 11/21/21   Rodriguez-Southworth, Nettie Elm, PA-C  fluconazole (DIFLUCAN) 150 MG tablet Take 1 tablet (150 mg total) by mouth once a week. For 6 months. 03/31/23   Hildred Laser, MD  predniSONE (STERAPRED UNI-PAK 21 TAB) 10 MG (21) TBPK tablet Take by mouth daily. As package directed 04/22/23   Shaena Parkerson, Para March, NP    Family History Family History  Problem Relation Age of Onset   Healthy Mother    Healthy Father    Healthy Brother    Healthy Brother    Healthy Brother    Healthy Brother    Healthy Sister    Diabetes Paternal Grandmother    Cancer Paternal Aunt        unknown cancer    Social History Social History   Tobacco Use   Smoking status: Never   Smokeless tobacco: Never  Vaping Use   Vaping status: Never Used  Substance Use Topics  Alcohol use: Yes    Comment: occasional   Drug use: No     Allergies   Patient has no known allergies.   Review of Systems Review of Systems  Constitutional:  Negative for fever.  Skin:  Positive for rash.  All other systems reviewed and are negative.    Physical Exam Triage Vital Signs ED Triage Vitals [04/22/23 1316]  Encounter Vitals Group     BP      Systolic BP Percentile      Diastolic BP Percentile      Pulse      Resp      Temp      Temp src      SpO2      Weight      Height      Head Circumference      Peak Flow      Pain Score 2     Pain Loc      Pain Education      Exclude from Growth Chart    No data found.  Updated Vital Signs BP 128/79   Pulse 96   Temp 98.3 F (36.8 C)   Resp 18   LMP 04/18/2023   SpO2 98%   Visual Acuity Right Eye Distance:   Left Eye Distance:   Bilateral Distance:    Right Eye Near:   Left Eye Near:    Bilateral Near:     Physical  Exam Vitals and nursing note reviewed.  Skin:    General: Skin is warm.     Capillary Refill: Capillary refill takes less than 2 seconds.     Findings: Rash present. Rash is macular and papular.     Comments: Left arm and abdomen, slight erythema, no drainage  Neurological:     General: No focal deficit present.     Mental Status: She is alert and oriented to person, place, and time.     GCS: GCS eye subscore is 4. GCS verbal subscore is 5. GCS motor subscore is 6.  Psychiatric:        Attention and Perception: Attention normal.        Mood and Affect: Mood normal.        Speech: Speech normal.        Behavior: Behavior normal.      UC Treatments / Results  Labs (all labs ordered are listed, but only abnormal results are displayed) Labs Reviewed - No data to display  EKG   Radiology No results found.  Procedures Procedures (including critical care time)  Medications Ordered in UC Medications - No data to display  Initial Impression / Assessment and Plan / UC Course  I have reviewed the triage vital signs and the nursing notes.  Pertinent labs & imaging results that were available during my care of the patient were reviewed by me and considered in my medical decision making (see chart for details).     Ddx: Contact dermatitis, allergies, Rash Final Clinical Impressions(s) / UC Diagnoses   Final diagnoses:  Contact dermatitis due to soap     Discharge Instructions      Avoid heat and hot water as it makes rashes worse.  Use steroid as directed.  Follow-up with PCP, return as needed.      ED Prescriptions     Medication Sig Dispense Auth. Provider   predniSONE (STERAPRED UNI-PAK 21 TAB) 10 MG (21) TBPK tablet  (Status: Discontinued) Take by mouth daily. As package directed  21 tablet Ellysa Parrack, NP   predniSONE (STERAPRED UNI-PAK 21 TAB) 10 MG (21) TBPK tablet Take by mouth daily. As package directed 21 tablet Abagail Limb, Para March, NP      PDMP  not reviewed this encounter.   Clancy Gourd, NP 04/22/23 1346

## 2023-04-22 NOTE — Discharge Instructions (Addendum)
Avoid heat and hot water as it makes rashes worse.  Use steroid as directed.  Follow-up with PCP, return as needed.

## 2023-06-12 ENCOUNTER — Telehealth: Payer: Self-pay | Admitting: Obstetrics and Gynecology

## 2023-06-12 NOTE — Telephone Encounter (Signed)
Tried reaching out to patient to get her set up for annual appt with Dr Valentino Saxon. Unable to leave message vm not set up.

## 2023-08-05 NOTE — Progress Notes (Deleted)
GYNECOLOGY ANNUAL PHYSICAL EXAM PROGRESS NOTE  Subjective:    Dorothy Young is a 32 y.o. [phone] female who presents for an annual exam.  The patient {is/is not/has never been:13135} sexually active. The patient participates in regular exercise: {yes/no/not asked:9010}. Has the patient ever been transfused or tattooed?: {yes/no/not asked:9010}. The patient reports that there {is/is not:9024} domestic violence in her life.   The patient has the following complaints today:   Menstrual History: Menarche age: 2 No LMP recorded.     Gynecologic History:  Contraception: IUD History of STI's:  Chlamydia 11 years ago.  Last Pap: 09/11/2020. Results were: normal. Denies h/o abnormal pap smears.   OB History  Gravida Para Term Preterm AB Living  7 4 4  0 3 4  SAB IAB Ectopic Multiple Live Births  1 1 0 0 4    # Outcome Date GA Lbr Len/2nd Weight Sex Type Anes PTL Lv  7 IAB 09/2019          6 Term 10/01/17 [redacted]w[redacted]d  7 lb 12.9 oz (3.54 kg) F Vag-Spont None  LIV     Name: Diana,GIRL Sherrika     Apgar1: 9  Apgar5: 9  5 SAB 06/08/16 [redacted]w[redacted]d         4 Term 08/30/14 [redacted]w[redacted]d 01:44 / 00:23 6 lb 13 oz (3.09 kg) M Vag-Spont None  LIV     Apgar1: 8  Apgar5: 9  3 AB 2012 [redacted]w[redacted]d    TAB     2 Term 07/22/10 [redacted]w[redacted]d  6 lb 8 oz (2.948 kg) F Vag-Spont None N LIV  1 Term 09/15/08 [redacted]w[redacted]d  6 lb 7 oz (2.92 kg) F Vag-Spont None N LIV    Past Medical History:  Diagnosis Date   Anemia    thalassemia Beta minor   H/O chlamydia infection 2011   Pregnancy    Thalassemia, beta (HCC) 05/18/2014   Worsening headaches     Past Surgical History:  Procedure Laterality Date   CERVICAL BIOPSY  W/ LOOP ELECTRODE EXCISION     LEEP N/A 05/30/2019   Procedure: LOOP ELECTROSURGICAL EXCISION PROCEDURE (LEEP);  Surgeon: Hildred Laser, MD;  Location: [facility] ORS;  Service: Gynecology;  Laterality: N/A;   NO PAST SURGERIES      Family History  Problem Relation Age of Onset   Healthy Mother    Healthy Father     Healthy Brother    Healthy Brother    Healthy Brother    Healthy Brother    Healthy Sister    Diabetes Paternal Grandmother    Cancer Paternal Aunt        unknown cancer    Social History   Socioeconomic History   Marital status: Single    Spouse name: Not on file   Number of children: 2   Years of education: 11   Highest education level: Not on file  Occupational History    Employer: UNEMPLOYED  Tobacco Use   Smoking status: Never   Smokeless tobacco: Never  Vaping Use   Vaping status: Never Used  Substance and Sexual Activity   Alcohol use: Yes    Comment: occasional   Drug use: No   Sexual activity: Yes    Birth control/protection: I.U.D.  Other Topics Concern   Not on file  Social History Narrative   Patient is single and lives with her mother.   Patient has two children.   Patient is not currently not working.   Patient has a high  school education.   Patient is right-handed.   Patient drinks very little soda- not everyday.   Social Drivers of Corporate investment banker Strain: Not on file  Food Insecurity: Not on file  Transportation Needs: Not on file  Physical Activity: Not on file  Stress: Not on file  Social Connections: Not on file  Intimate Partner Violence: Not on file    Current Outpatient Medications on File Prior to Visit  Medication Sig Dispense Refill   clindamycin (CLEOCIN) 300 MG capsule Take 1 capsule (300 mg total) by mouth 2 (two) times daily. For seven days 14 capsule 0   albuterol (VENTOLIN HFA) 108 (90 Base) MCG/ACT inhaler Inhale 2 puffs into the lungs every 4 (four) hours as needed for wheezing or shortness of breath. Prn cough attacks 16 g 0   fluconazole (DIFLUCAN) 150 MG tablet Take 1 tablet (150 mg total) by mouth once a week. For 6 months. 24 tablet 0   predniSONE (STERAPRED UNI-PAK 21 TAB) 10 MG (21) TBPK tablet Take by mouth daily. As package directed 21 tablet 0   No current facility-administered medications on file prior  to visit.    No Known Allergies   Review of Systems Constitutional: negative for chills, fatigue, fevers and sweats Eyes: negative for irritation, redness and visual disturbance Ears, nose, mouth, throat, and face: negative for hearing loss, nasal congestion, snoring and tinnitus Respiratory: negative for asthma, cough, sputum Cardiovascular: negative for chest pain, dyspnea, exertional chest pressure/discomfort, irregular heart beat, palpitations and syncope Gastrointestinal: negative for abdominal pain, change in bowel habits, nausea and vomiting Genitourinary: negative for abnormal menstrual periods, genital lesions, sexual problems and vaginal discharge, dysuria and urinary incontinence Integument/breast: negative for breast lump, breast tenderness and nipple discharge Hematologic/lymphatic: negative for bleeding and easy bruising Musculoskeletal:negative for back pain and muscle weakness Neurological: negative for dizziness, headaches, vertigo and weakness Endocrine: negative for diabetic symptoms including polydipsia, polyuria and skin dryness Allergic/Immunologic: negative for hay fever and urticaria      Objective:  There were no vitals taken for this visit. There is no height or weight on file to calculate BMI.    General Appearance:    Alert, cooperative, no distress, appears stated age  Head:    Normocephalic, without obvious abnormality, atraumatic  Eyes:    PERRL, conjunctiva/corneas clear, EOM's intact, both eyes  Ears:    Normal external ear canals, both ears  Nose:   Nares normal, septum midline, mucosa normal, no drainage or sinus tenderness  Throat:   Lips, mucosa, and tongue normal; teeth and gums normal  Neck:   Supple, symmetrical, trachea midline, no adenopathy; thyroid: no enlargement/tenderness/nodules; no carotid bruit or JVD  Back:     Symmetric, no curvature, ROM normal, no CVA tenderness  Lungs:     Clear to auscultation bilaterally, respirations  unlabored  Chest Wall:    No tenderness or deformity   Heart:    Regular rate and rhythm, S1 and S2 normal, no murmur, rub or gallop  Breast Exam:    No tenderness, masses, or nipple abnormality  Abdomen:     Soft, non-tender, bowel sounds active all four quadrants, no masses, no organomegaly.    Genitalia:    Pelvic:external genitalia normal, vagina without lesions, discharge, or tenderness, rectovaginal septum  normal. Cervix normal in appearance, no cervical motion tenderness, no adnexal masses or tenderness.  Uterus normal size, shape, mobile, regular contours, nontender.  Rectal:    Normal external sphincter.  No hemorrhoids  appreciated. Internal exam not done.   Extremities:   Extremities normal, atraumatic, no cyanosis or edema  Pulses:   2+ and symmetric all extremities  Skin:   Skin color, texture, turgor normal, no rashes or lesions  Lymph nodes:   Cervical, supraclavicular, and axillary nodes normal  Neurologic:   CNII-XII intact, normal strength, sensation and reflexes throughout   .  Labs:  Lab Results  Component Value Date   WBC 4.7 10/18/2021   HGB 13.3 10/18/2021   HCT 39.9 10/18/2021   MCV 77 (L) 10/18/2021   PLT 218 10/18/2021    Lab Results  Component Value Date   CREATININE 0.74 10/18/2021   BUN 9 10/18/2021   NA 138 10/18/2021   K 4.2 10/18/2021   CL 101 10/18/2021   CO2 22 10/18/2021    Lab Results  Component Value Date   ALT 20 10/18/2021   AST 17 10/18/2021   ALKPHOS 67 10/18/2021   BILITOT 0.4 10/18/2021    Lab Results  Component Value Date   TSH 0.410 (L) 10/18/2021     Assessment:   1. Recurrent vaginitis      Plan:  Blood tests: {blood tests:13147}. Breast self exam technique reviewed and patient encouraged to perform self-exam monthly. Contraception: IUD. Discussed healthy lifestyle modifications. Mammogram  : Not age appropriate Pap smear  UTD . Flu vaccine: Follow up in 1 year for annual exam   Hildred Laser, MD [place]  [facility]

## 2023-08-06 ENCOUNTER — Ambulatory Visit: Payer: Medicaid Other | Admitting: Obstetrics and Gynecology

## 2023-08-06 DIAGNOSIS — N760 Acute vaginitis: Secondary | ICD-10-CM

## 2023-08-18 ENCOUNTER — Ambulatory Visit: Payer: Medicaid Other | Admitting: Obstetrics and Gynecology

## 2023-09-08 ENCOUNTER — Ambulatory Visit: Payer: Medicaid Other | Admitting: Obstetrics and Gynecology

## 2023-09-20 ENCOUNTER — Other Ambulatory Visit: Payer: Self-pay | Admitting: Obstetrics and Gynecology

## 2023-09-21 NOTE — Telephone Encounter (Signed)
No.  She is at the end of her treatment period. Needs to take a break.

## 2023-09-22 NOTE — Progress Notes (Signed)
 GYNECOLOGY ANNUAL PHYSICAL EXAM PROGRESS NOTE  Subjective:    Dorothy Young is a 33 y.o. [phone] female who presents for an annual exam.  The patient is sexually active. The patient participates in regular exercise: no. Has the patient ever been transfused or tattooed?: no. The patient reports that there is not domestic violence in her life.    The patient has the following complaints today: She reports that she has had some vaginal odor and discharge over the past several days. She has a history of recurrent yeast and BV.  Of note patient recently completed 20-month prophylactic course due to history of recurrent yeast which patient reports was helpful.  Notes that she is still trying to eat a fairly healthy diet, avoid sweets.  Attempted to utilize medicinal soap however reports breaking out in a rash.  Is currently using Dove soap.  Menstrual History: Menarche age: 86 Patient's last menstrual period was 09/09/2023 (approximate).    Gynecologic History:  Contraception: condoms History of STI's: Chlamydia Last Pap: 09/11/2020. Results were: normal. Notes h/o abnormal pap smears. (04/01/2017: HGSIL pap smear. 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  04/22/2019: LGSIL, with few cells suggestive of a higher grade lesion.).  S/p LEEP in 2020.  Last mammogram: Not age appropriate    OB History  Gravida Para Term Preterm AB Living  7 4 4  0 3 4  SAB IAB Ectopic Multiple Live Births  1 1 0 0 4    # Outcome Date GA Lbr Len/2nd Weight Sex Type Anes PTL Lv  7 IAB 09/2019          6 Term 10/01/17 [redacted]w[redacted]d  7 lb 12.9 oz (3.54 kg) F Vag-Spont None  LIV     Name: Filsaime,GIRL Dala     Apgar1: 9  Apgar5: 9  5 SAB 06/08/16 [redacted]w[redacted]d         4 Term 08/30/14 [redacted]w[redacted]d 01:44 / 00:23 6 lb 13 oz (3.09 kg) M Vag-Spont None  LIV     Apgar1: 8  Apgar5: 9  3 AB 2012 [redacted]w[redacted]d    TAB     2 Term 07/22/10 [redacted]w[redacted]d  6 lb 8 oz (2.948 kg) F Vag-Spont None N LIV  1 Term 09/15/08 [redacted]w[redacted]d  6  lb 7 oz (2.92 kg) F Vag-Spont None N LIV    Past Medical History:  Diagnosis Date   Anemia    thalassemia Beta minor   CIN III (cervical intraepithelial neoplasia grade III) with severe dysplasia 03/04/2017   History of LEEP in 2020.      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   Worsening headaches     Past Surgical History:  Procedure Laterality Date   CERVICAL BIOPSY  W/ LOOP ELECTRODE EXCISION     LEEP N/A 05/30/2019   Procedure: LOOP ELECTROSURGICAL EXCISION PROCEDURE (LEEP);  Surgeon: Connell Davies, MD;  Location: [facility] ORS;  Service: Gynecology;  Laterality: N/A;    Family History  Problem Relation Age of Onset   Healthy Mother    Healthy Father    Healthy Brother    Healthy Brother    Healthy Brother    Healthy Brother    Healthy Sister    Diabetes Paternal Grandmother    Cancer Paternal Aunt        unknown cancer    Social History   Socioeconomic History   Marital status: Single  Spouse name: Not on file   Number of children: 2   Years of education: 78   Highest education level: Not on file  Occupational History    Employer: UNEMPLOYED  Tobacco Use   Smoking status: Never   Smokeless tobacco: Never  Vaping Use   Vaping status: Never Used  Substance and Sexual Activity   Alcohol use: Yes    Comment: occasional   Drug use: No   Sexual activity: Yes    Birth control/protection: I.U.D.  Other Topics Concern   Not on file  Social History Narrative   Patient is single and lives with her mother.   Patient has two children.   Patient is not currently not working.   Patient has a high school education.   Patient is right-handed.   Patient drinks very little soda- not everyday.   Social Drivers of Corporate Investment Banker Strain: Not on file  Food Insecurity: Not on file  Transportation Needs: Not on file  Physical Activity: Not on file  Stress: Not on file  Social Connections: Not on file   Intimate Partner Violence: Not on file    Current Outpatient Medications on File Prior to Visit  Medication Sig Dispense Refill   clindamycin  (CLEOCIN ) 300 MG capsule Take 1 capsule (300 mg total) by mouth 2 (two) times daily. For seven days 14 capsule 0   albuterol  (VENTOLIN  HFA) 108 (90 Base) MCG/ACT inhaler Inhale 2 puffs into the lungs every 4 (four) hours as needed for wheezing or shortness of breath. Prn cough attacks 16 g 0   fluconazole  (DIFLUCAN ) 150 MG tablet Take 1 tablet (150 mg total) by mouth once a week. For 6 months. 24 tablet 0   predniSONE  (STERAPRED UNI-PAK 21 TAB) 10 MG (21) TBPK tablet Take by mouth daily. As package directed 21 tablet 0   No current facility-administered medications on file prior to visit.    No Known Allergies   Review of Systems Constitutional: negative for chills, fatigue, fevers and sweats Eyes: negative for irritation, redness and visual disturbance Ears, nose, mouth, throat, and face: negative for hearing loss, nasal congestion, snoring and tinnitus Respiratory: negative for asthma, cough, sputum Cardiovascular: negative for chest pain, dyspnea, exertional chest pressure/discomfort, irregular heart beat, palpitations and syncope Gastrointestinal: negative for abdominal pain, change in bowel habits, nausea and vomiting Genitourinary: negative for abnormal menstrual periods, genital lesions, sexual problems, dysuria and urinary incontinence. Positive for vaginal discharge Integument/breast: negative for breast lump, breast tenderness and nipple discharge Hematologic/lymphatic: negative for bleeding and easy bruising Musculoskeletal:negative for back pain and muscle weakness Neurological: negative for dizziness, headaches, vertigo and weakness Endocrine: negative for diabetic symptoms including polydipsia, polyuria and skin dryness Allergic/Immunologic: negative for hay fever and urticaria      Objective:  Blood pressure 117/66, pulse 93,  height 5' 10 (1.778 m), weight 198 lb 14.4 oz (90.2 kg), last menstrual period 09/09/2023.  Body mass index is 28.54 kg/m.    General Appearance:    Alert, cooperative, no distress, appears stated age  Head:    Normocephalic, without obvious abnormality, atraumatic  Eyes:    PERRL, conjunctiva/corneas clear, EOM's intact, both eyes  Ears:    Normal external ear canals, both ears  Nose:   Nares normal, septum midline, mucosa normal, no drainage or sinus tenderness  Throat:   Lips, mucosa, and tongue normal; teeth and gums normal  Neck:   Supple, symmetrical, trachea midline, no adenopathy; thyroid : no enlargement/tenderness/nodules; no carotid bruit or  JVD  Back:     Symmetric, no curvature, ROM normal, no CVA tenderness  Lungs:     Clear to auscultation bilaterally, respirations unlabored  Chest Wall:    No tenderness or deformity   Heart:    Regular rate and rhythm, S1 and S2 normal, no murmur, rub or gallop  Breast Exam:    No tenderness, masses, or nipple abnormality  Abdomen:     Soft, non-tender, bowel sounds active all four quadrants, no masses, no organomegaly.    Genitalia:    Pelvic:external genitalia normal, vagina without lesions, or tenderness, moderate thick discharge present.  Rectovaginal septum  normal. Cervix normal in appearance, no cervical motion tenderness, no adnexal masses or tenderness.  Uterus normal size, shape, mobile, regular contours, nontender.  Rectal:    Normal external sphincter.  No hemorrhoids appreciated. Internal exam not done.   Extremities:   Extremities normal, atraumatic, no cyanosis or edema  Pulses:   2+ and symmetric all extremities  Skin:   Skin color, texture, turgor normal, no rashes or lesions  Lymph nodes:   Cervical, supraclavicular, and axillary nodes normal  Neurologic:   CNII-XII intact, normal strength, sensation and reflexes throughout   .  Labs:  Lab Results  Component Value Date   WBC 4.7 10/18/2021   HGB 13.3 10/18/2021   HCT  39.9 10/18/2021   MCV 77 (L) 10/18/2021   PLT 218 10/18/2021    Lab Results  Component Value Date   CREATININE 0.74 10/18/2021   BUN 9 10/18/2021   NA 138 10/18/2021   K 4.2 10/18/2021   CL 101 10/18/2021   CO2 22 10/18/2021    Lab Results  Component Value Date   ALT 20 10/18/2021   AST 17 10/18/2021   ALKPHOS 67 10/18/2021   BILITOT 0.4 10/18/2021    Lab Results  Component Value Date   TSH 0.410 (L) 10/18/2021     Assessment:   1. Encounter for well woman exam with routine gynecological exam   2. Cervical cancer screening   3. Encounter for hepatitis C screening test for low risk patient   4. Subclinical hyperthyroidism   5. Screening for diabetes mellitus (DM)   6. Screening cholesterol level   7. Recurrent vaginitis      Plan:  - Blood tests: see orders. - Breast self exam technique reviewed and patient encouraged to perform self-exam monthly. - Contraception: condoms.  Advised to utilize latex free or even consider lamb skin condoms if she is with a long-term partner as this may decrease her risk of recurrent vaginitis. - Discussed healthy lifestyle modifications. - Mammogram  : Not age appropriate - Pap smear ordered. - Flu vaccine: Declined - Tdap vaccine: Hesitant to receive today.  Can offer at a later date. - Recurrent vaginitis, vaginal culture sent however presentation likely consistent with yeast.  Diflucan  prescription sent with refills. -History of subclinical hyperthyroidism.  Will recheck levels today. Follow up in 1 year for annual exam   Archie Savers, MD [facility]

## 2023-09-23 ENCOUNTER — Ambulatory Visit (INDEPENDENT_AMBULATORY_CARE_PROVIDER_SITE_OTHER): Payer: Medicaid Other | Admitting: Obstetrics and Gynecology

## 2023-09-23 ENCOUNTER — Encounter: Payer: Self-pay | Admitting: Obstetrics and Gynecology

## 2023-09-23 ENCOUNTER — Other Ambulatory Visit (HOSPITAL_COMMUNITY)
Admission: RE | Admit: 2023-09-23 | Discharge: 2023-09-23 | Disposition: A | Discharge status: Home / Self Care | Payer: Medicaid Other | Source: Ambulatory Visit | Attending: Obstetrics and Gynecology | Admitting: Obstetrics and Gynecology

## 2023-09-23 VITALS — BP 117/66 | HR 93 | Ht 70.0 in | Wt 198.9 lb

## 2023-09-23 DIAGNOSIS — Z1159 Encounter for screening for other viral diseases: Secondary | ICD-10-CM

## 2023-09-23 DIAGNOSIS — N898 Other specified noninflammatory disorders of vagina: Secondary | ICD-10-CM | POA: Insufficient documentation

## 2023-09-23 DIAGNOSIS — Z124 Encounter for screening for malignant neoplasm of cervix: Secondary | ICD-10-CM | POA: Diagnosis not present

## 2023-09-23 DIAGNOSIS — Z13220 Encounter for screening for lipoid disorders: Secondary | ICD-10-CM | POA: Diagnosis not present

## 2023-09-23 DIAGNOSIS — E0590 Thyrotoxicosis, unspecified without thyrotoxic crisis or storm: Secondary | ICD-10-CM | POA: Diagnosis not present

## 2023-09-23 DIAGNOSIS — Z01419 Encounter for gynecological examination (general) (routine) without abnormal findings: Secondary | ICD-10-CM | POA: Insufficient documentation

## 2023-09-23 DIAGNOSIS — Z131 Encounter for screening for diabetes mellitus: Secondary | ICD-10-CM

## 2023-09-23 DIAGNOSIS — N760 Acute vaginitis: Secondary | ICD-10-CM

## 2023-09-23 MED ORDER — FLUCONAZOLE 150 MG PO TABS: 150.0000 mg | ORAL_TABLET | ORAL | 3 refills | Status: AC

## 2023-09-24 LAB — CBC
Hematocrit: 37.9 % (ref 34.0–46.6)
Hemoglobin: 12 g/dL (ref 11.1–15.9)
MCH: 25.3 pg — ABNORMAL LOW (ref 26.6–33.0)
MCHC: 31.7 g/dL (ref 31.5–35.7)
MCV: 80 fL (ref 79–97)
Platelets: 183 10*3/uL (ref 150–450)
RBC: 4.74 x10E6/uL (ref 3.77–5.28)
RDW: 15.9 % — ABNORMAL HIGH (ref 11.7–15.4)
WBC: 6.3 10*3/uL (ref 3.4–10.8)

## 2023-09-24 LAB — LIPID PANEL
Chol/HDL Ratio: 2.8 {ratio} (ref 0.0–4.4)
Cholesterol, Total: 164 mg/dL (ref 100–199)
HDL: 58 mg/dL (ref >39)
LDL Chol Calc (NIH): 90 mg/dL (ref 0–99)
Triglycerides: 85 mg/dL (ref 0–149)
VLDL Cholesterol Cal: 16 mg/dL (ref 5–40)

## 2023-09-24 LAB — COMPREHENSIVE METABOLIC PANEL
ALT: 17 [IU]/L (ref 0–32)
AST: 13 [IU]/L (ref 0–40)
Albumin: 4.1 g/dL (ref 3.9–4.9)
Alkaline Phosphatase: 59 [IU]/L (ref 44–121)
BUN/Creatinine Ratio: 18 (ref 9–23)
BUN: 12 mg/dL (ref 6–20)
Bilirubin Total: 0.3 mg/dL (ref 0.0–1.2)
CO2: 20 mmol/L (ref 20–29)
Calcium: 8.8 mg/dL (ref 8.7–10.2)
Chloride: 106 mmol/L (ref 96–106)
Creatinine, Ser: 0.66 mg/dL (ref 0.57–1.00)
Globulin, Total: 2.8 g/dL (ref 1.5–4.5)
Glucose: 99 mg/dL (ref 70–99)
Potassium: 4.1 mmol/L (ref 3.5–5.2)
Sodium: 140 mmol/L (ref 134–144)
Total Protein: 6.9 g/dL (ref 6.0–8.5)
eGFR: 120 mL/min/{1.73_m2} (ref >59)

## 2023-09-24 LAB — HEMOGLOBIN A1C
Est. average glucose Bld gHb Est-mCnc: 103 mg/dL
Hgb A1c MFr Bld: 5.2 % (ref 4.8–5.6)

## 2023-09-24 LAB — TSH: TSH: 1.06 u[IU]/mL (ref 0.450–4.500)

## 2023-09-25 ENCOUNTER — Encounter: Payer: Self-pay | Admitting: Obstetrics and Gynecology

## 2023-09-25 ENCOUNTER — Other Ambulatory Visit: Payer: Self-pay | Admitting: Obstetrics and Gynecology

## 2023-09-25 LAB — CERVICOVAGINAL ANCILLARY ONLY
Bacterial Vaginitis (gardnerella): POSITIVE — AB
Candida Glabrata: NEGATIVE
Candida Vaginitis: NEGATIVE
Comment: NEGATIVE
Comment: NEGATIVE
Comment: NEGATIVE

## 2023-09-25 MED ORDER — CLINDAMYCIN HCL 300 MG PO CAPS: 300.0000 mg | ORAL_CAPSULE | Freq: Two times a day (BID) | ORAL | 0 refills | Status: AC

## 2023-10-02 LAB — CYTOLOGY - PAP
Comment: NEGATIVE
Diagnosis: NEGATIVE
High risk HPV: NEGATIVE

## 2023-11-17 ENCOUNTER — Ambulatory Visit

## 2023-11-17 ENCOUNTER — Other Ambulatory Visit (HOSPITAL_COMMUNITY)
Admission: RE | Admit: 2023-11-17 | Discharge: 2023-11-17 | Disposition: A | Discharge status: Home / Self Care | Source: Ambulatory Visit | Attending: Obstetrics and Gynecology | Admitting: Obstetrics and Gynecology

## 2023-11-17 VITALS — BP 124/82 | HR 83 | Ht 70.0 in | Wt 192.0 lb

## 2023-11-17 DIAGNOSIS — N898 Other specified noninflammatory disorders of vagina: Secondary | ICD-10-CM | POA: Diagnosis present

## 2023-11-17 NOTE — Progress Notes (Signed)
    NURSE VISIT NOTE  Subjective:    Patient ID: Dorothy Young, female    DOB: 1991/02/28, 33 y.o.   MRN: [id number]  HPI  Patient is a 33 y.o. [id number] female who presents for white and curd-like vaginal discharge for 3 day(s). Denies abnormal vaginal bleeding or significant pelvic pain or fever. denies hematuria and urinary frequency. Patient denies history of known exposure to STD.   Objective:    Ht 5\' 10"  (1.778 m)   Wt 192 lb (87.1 kg)   BMI 27.55 kg/m      Assessment:   1. Vaginal discharge       Plan:   GC and chlamydia DNA  probe sent to lab. Treatment: abstain from coitus during course of treatment ROV prn if symptoms persist or worsen.   Loney Laurence, CMA

## 2023-11-19 ENCOUNTER — Other Ambulatory Visit: Payer: Self-pay | Admitting: Obstetrics

## 2023-11-19 LAB — CERVICOVAGINAL ANCILLARY ONLY
Bacterial Vaginitis (gardnerella): NEGATIVE
Candida Glabrata: NEGATIVE
Candida Vaginitis: POSITIVE — AB
Chlamydia: NEGATIVE
Comment: NEGATIVE
Comment: NEGATIVE
Comment: NEGATIVE
Comment: NEGATIVE
Comment: NEGATIVE
Comment: NORMAL
Neisseria Gonorrhea: NEGATIVE
Trichomonas: NEGATIVE

## 2023-12-05 ENCOUNTER — Ambulatory Visit
Admission: RE | Admit: 2023-12-05 | Discharge: 2023-12-05 | Disposition: A | Discharge status: Home / Self Care | Source: Ambulatory Visit | Attending: Emergency Medicine

## 2023-12-05 VITALS — BP 113/78 | HR 92 | Temp 97.8°F | Resp 17

## 2023-12-05 DIAGNOSIS — J029 Acute pharyngitis, unspecified: Secondary | ICD-10-CM | POA: Diagnosis not present

## 2023-12-05 LAB — POCT RAPID STREP A (OFFICE): Rapid Strep A Screen: NEGATIVE

## 2023-12-05 NOTE — ED Triage Notes (Signed)
 Patient to Urgent Care with complaints of sore throat that started yesterday morning. Denies any known fevers.  Reports seeing white spots on her tonsils.  Meds: ibuprofen .

## 2023-12-05 NOTE — ED Provider Notes (Signed)
 Dorothy Young    CSN: [id number] Arrival date & time: 12/05/23  1247      History   Chief Complaint Chief Complaint  Patient presents with   Oral Swelling    Swollen/pain/ white spots on tonsils - Entered by patient    HPI Dorothy Young is a 33 y.o. female.  Patient presents with 1 day history of sore throat and white spots on her tonsils.  No fever, cough, shortness of breath, rash.  She has been treating her symptoms with ibuprofen .  The history is provided by the patient and medical records.    Past Medical History:  Diagnosis Date   Anemia    thalassemia Beta minor   CIN III (cervical intraepithelial neoplasia grade III) with severe dysplasia 03/04/2017   History of LEEP in 2020.      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 Authorizing Provider  albuterol  (VENTOLIN  HFA) 108 (90 Base) MCG/ACT inhaler Inhale 2 puffs into the lungs every 4 (four) hours as needed for wheezing or shortness of breath. Prn cough attacks Patient not taking: Reported on 11/17/2023 11/21/21    Rodriguez-Southworth, Sylvia, PA-C  clindamycin  (CLEOCIN ) 300 MG capsule Take 1 capsule (300 mg total) by mouth 2 (two) times daily. For seven days Patient not taking: Reported on 11/17/2023 09/25/23   Teresa Fender, MD  fluconazole  (DIFLUCAN ) 150 MG tablet Take 1 tablet (150 mg total) by mouth every 3 (three) days. For three doses Patient not taking: Reported on 11/17/2023 09/23/23   Teresa Fender, MD  predniSONE  (STERAPRED UNI-PAK 21 TAB) 10 MG (21) TBPK tablet Take by mouth daily. As package directed Patient not taking: Reported on 11/17/2023 04/22/23   Defelice, Eveleen Hinds, NP    Family History Family History  Problem Relation Age of Onset   Healthy Mother    Healthy Father    Healthy Brother    Healthy Brother    Healthy Brother    Healthy Brother    Healthy Sister    Diabetes Paternal Grandmother    Cancer Paternal Aunt        unknown cancer    Social History Social History   Tobacco Use   Smoking status: Never   Smokeless tobacco: Never  Vaping Use   Vaping status:  Never Used  Substance Use Topics   Alcohol use: Yes    Comment: occasional   Drug use: No     Allergies   Patient has no known allergies.   Review of Systems Review of Systems  Constitutional:  Negative for chills and fever.  HENT:  Positive for sore throat. Negative for ear pain.   Respiratory:  Negative for cough and shortness of breath.      Physical Exam Triage Vital Signs ED Triage Vitals  Encounter Vitals Group     BP 12/05/23 1258 113/78     Systolic BP Percentile --      Diastolic BP Percentile --      Pulse Rate 12/05/23 1258 92     Resp 12/05/23 1258 17     Temp 12/05/23 1258 97.8 F (36.6 C)     Temp src --      SpO2 12/05/23 1258 98 %     Weight --      Height --      Head Circumference --      Peak Flow --      Pain Score 12/05/23 1256 7     Pain Loc --      Pain Education --      Exclude from Growth Chart --    No data found.  Updated Vital Signs BP 113/78   Pulse 92    Temp 97.8 F (36.6 C)   Resp 17   LMP 11/28/2023   SpO2 98%   Visual Acuity Right Eye Distance:   Left Eye Distance:   Bilateral Distance:    Right Eye Near:   Left Eye Near:    Bilateral Near:     Physical Exam Constitutional:      General: She is not in acute distress. HENT:     Right Ear: Tympanic membrane normal.     Left Ear: Tympanic membrane normal.     Nose: Nose normal.     Mouth/Throat:     Mouth: Mucous membranes are moist.     Pharynx: Oropharyngeal exudate and posterior oropharyngeal erythema present.  Cardiovascular:     Rate and Rhythm: Normal rate and regular rhythm.     Heart sounds: Normal heart sounds.  Pulmonary:     Effort: Pulmonary effort is normal. No respiratory distress.     Breath sounds: Normal breath sounds.  Neurological:     Mental Status: She is alert.      UC Treatments / Results  Labs (all labs ordered are listed, but only abnormal results are displayed) Labs Reviewed  POCT RAPID STREP A (OFFICE)    EKG   Radiology No results found.  Procedures Procedures (including critical care time)  Medications Ordered in UC Medications - No data to display  Initial Impression / Assessment and Plan / UC Course  I have reviewed the triage vital signs and the nursing notes.  Pertinent labs & imaging results that were available during my care of the patient were reviewed by me and considered in my medical decision making (see chart for details).   Acute pharyngitis.  Rapid strep negative.  Discussed symptomatic treatment including Tylenol  or ibuprofen , salt water gargles, honey.  Education provided on pharyngitis.  Instructed patient to follow up with her PCP if her symptoms are not improving.  She agrees to plan of care.   Final Clinical Impressions(s) / UC Diagnoses   Final diagnoses:  Acute pharyngitis, unspecified etiology     Discharge Instructions  The strep test is negative.    Take Tylenol  or ibuprofen  as needed  for discomfort.  Follow-up with your primary care provider if your symptoms are not improving.      ED Prescriptions   None    PDMP not reviewed this encounter.   Wellington Half, NP 12/05/23 1322

## 2023-12-05 NOTE — Discharge Instructions (Addendum)
The strep test is negative.   ? ?Take Tylenol or ibuprofen as needed for discomfort.  Follow up with your primary care provider if your symptoms are not improving.   ? ?

## 2023-12-11 ENCOUNTER — Ambulatory Visit
Admission: RE | Admit: 2023-12-11 | Discharge: 2023-12-11 | Disposition: A | Discharge status: Home / Self Care | Payer: Self-pay | Source: Ambulatory Visit | Attending: Emergency Medicine | Admitting: Emergency Medicine

## 2023-12-11 VITALS — BP 121/87 | HR 75 | Temp 98.5°F | Resp 18

## 2023-12-11 DIAGNOSIS — J069 Acute upper respiratory infection, unspecified: Secondary | ICD-10-CM

## 2023-12-11 MED ORDER — AMOXICILLIN-POT CLAVULANATE 875-125 MG PO TABS: 1.0000 | ORAL_TABLET | Freq: Two times a day (BID) | ORAL | 0 refills | Status: AC

## 2023-12-11 MED ORDER — PREDNISONE 20 MG PO TABS: 40.0000 mg | ORAL_TABLET | Freq: Every day | ORAL | 0 refills | Status: AC

## 2023-12-11 MED ORDER — BENZONATATE 100 MG PO CAPS: 100.0000 mg | ORAL_CAPSULE | Freq: Three times a day (TID) | ORAL | 0 refills | Status: AC

## 2023-12-11 MED ORDER — PROMETHAZINE-DM 6.25-15 MG/5ML PO SYRP: 5.0000 mL | ORAL_SOLUTION | Freq: Every evening | ORAL | 0 refills | Status: AC | PRN

## 2023-12-11 NOTE — ED Provider Notes (Signed)
 Arlander Bellman    CSN: [id number] Arrival date & time: 12/11/23  4098      History   Chief Complaint Chief Complaint  Patient presents with   Sore Throat    Swollen and sore throat. - Entered by patient    HPI Shawndra Iyanna Drummer is a 33 y.o. female.   Patient presents for evaluation of sore throat, nonproductive cough and centralized chest tightness present for 7 days.  Symptoms are persisting but have not worsened.  Has not attempted treatment.  Tolerating food and liquids.  No known sick contacts.  Was evaluated in this urgent care on April 19, rapid strep test negative at that time.  Past Medical History:  Diagnosis Date   Anemia    thalassemia Beta minor   CIN III (cervical intraepithelial neoplasia grade III) with severe dysplasia 03/04/2017   History of LEEP in 2020.      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 Authorizing Provider  amoxicillin-clavulanate (AUGMENTIN) 875-125 MG tablet Take 1 tablet by mouth every 12 (twelve) hours. 12/11/23  Yes Edelyn Heidel R, NP   benzonatate  (TESSALON ) 100 MG capsule Take 1 capsule (100 mg total) by mouth every 8 (eight) hours. 12/11/23  Yes Bailey Kolbe R, NP  predniSONE  (DELTASONE ) 20 MG tablet Take 2 tablets (40 mg total) by mouth daily. 12/11/23  Yes Kamarion Zagami, Maybelle Spatz, NP  promethazine -dextromethorphan (PROMETHAZINE -DM) 6.25-15 MG/5ML syrup Take 5 mLs by mouth at bedtime as needed. 12/11/23  Yes Indiah Heyden R, NP  albuterol  (VENTOLIN  HFA) 108 (90 Base) MCG/ACT inhaler Inhale 2 puffs into the lungs every 4 (four) hours as needed for wheezing or shortness of breath. Prn cough attacks Patient not taking: Reported on 11/17/2023 11/21/21   Rodriguez-Southworth, Sylvia, PA-C  clindamycin  (CLEOCIN ) 300 MG capsule Take 1 capsule (300 mg total) by mouth 2 (two) times daily. For seven days Patient not taking: Reported on 11/17/2023 09/25/23   Teresa Fender, MD  fluconazole  (DIFLUCAN ) 150 MG tablet Take 1 tablet (150 mg total) by mouth every 3 (three) days. For three doses Patient not taking: Reported on 11/17/2023 09/23/23   Teresa Fender, MD    Family History Family History  Problem Relation Age of Onset   Healthy Mother    Healthy Father  Healthy Brother    Healthy Brother    Healthy Brother    Healthy Brother    Healthy Sister    Diabetes Paternal Grandmother    Cancer Paternal Aunt        unknown cancer    Social History Social History   Tobacco Use   Smoking status: Never   Smokeless tobacco: Never  Vaping Use   Vaping status: Never Used  Substance Use Topics   Alcohol use: Yes    Comment: occasional   Drug use: No     Allergies   Patient has no known allergies.   Review of Systems Review of Systems   Physical Exam Triage Vital Signs ED Triage Vitals  Encounter Vitals Group     BP 12/11/23 1020 121/87     Systolic BP Percentile --      Diastolic BP Percentile --      Pulse Rate 12/11/23 1020 75     Resp 12/11/23 1020 18     Temp 12/11/23 1020 98.5 F (36.9 C)     Temp Source 12/11/23  1020 Oral     SpO2 12/11/23 1020 98 %     Weight --      Height --      Head Circumference --      Peak Flow --      Pain Score 12/11/23 1021 7     Pain Loc --      Pain Education --      Exclude from Growth Chart --    No data found.  Updated Vital Signs BP 121/87 (BP Location: Left Arm)   Pulse 75   Temp 98.5 F (36.9 C) (Oral)   Resp 18   LMP 11/28/2023   SpO2 98%   Visual Acuity Right Eye Distance:   Left Eye Distance:   Bilateral Distance:    Right Eye Near:   Left Eye Near:    Bilateral Near:     Physical Exam Constitutional:      Appearance: She is well-developed.  HENT:     Right Ear: Tympanic membrane and ear canal normal.     Left Ear: Tympanic membrane and ear canal normal.     Nose: No congestion or rhinorrhea.     Mouth/Throat:     Pharynx: No oropharyngeal exudate or posterior oropharyngeal erythema.     Tonsils: No tonsillar exudate. 2+ on the right. 2+ on the left.  Cardiovascular:     Rate and Rhythm: Normal rate and regular rhythm.     Heart sounds: Normal heart sounds.  Pulmonary:     Effort: Pulmonary effort is normal.     Breath sounds: Normal breath sounds.  Musculoskeletal:     Cervical back: Normal range of motion and neck supple.  Neurological:     Mental Status: She is alert and oriented to person, place, and time.      UC Treatments / Results  Labs (all labs ordered are listed, but only abnormal results are displayed) Labs Reviewed - No data to display  EKG   Radiology No results found.  Procedures Procedures (including critical care time)  Medications Ordered in UC Medications - No data to display  Initial Impression / Assessment and Plan / UC Course  I have reviewed the triage vital signs and the nursing notes.  Pertinent labs & imaging results that were available during my care of the patient were reviewed by me and considered in my medical decision making (see chart  for details).  Acute URI  Patient is in no  signs of distress nor toxic appearing.  Vital signs are stable.  Low suspicion for pneumonia, pneumothorax or bronchitis and therefore will defer imaging.  Symptoms present for 7 days without signs of resolution, prescribed Augmentin, for symptom management prescribed prednisone , Tessalon  and Promethazine  DM.May use additional over-the-counter medications as needed for supportive care.  May follow-up with urgent care as needed if symptoms persist or worsen.  Note given.   Final Clinical Impressions(s) / UC Diagnoses   Final diagnoses:  Acute URI     Discharge Instructions      Begin Augmentin every morning and every evening for 7 days  Begin prednisone  every morning with food for 5 days, helps to open and relax the airway  May use Tessalon  pill every 8 hours as needed for cough , may use cough syrup at bedtime to allow you to sleep   You can take Tylenol   as needed for fever reduction and pain relief.   For cough: honey 1/2 to 1 teaspoon (you can dilute the honey in water or another fluid).  You can also use guaifenesin  and dextromethorphan for cough. You can use a humidifier for chest congestion and cough.  If you don't have a humidifier, you can sit in the bathroom with the hot shower running.      For sore throat: try warm salt water gargles, cepacol lozenges, throat spray, warm tea or water with lemon/honey, popsicles or ice, or OTC cold relief medicine for throat discomfort.   For congestion: take a daily anti-histamine like Zyrtec, Claritin, and a oral decongestant, such as pseudoephedrine.  You can also use Flonase 1-2 sprays in each nostril daily.   It is important to stay hydrated: drink plenty of fluids (water, gatorade/powerade/pedialyte, juices, or teas) to keep your throat moisturized and help further relieve irritation/discomfort.    ED Prescriptions     Medication Sig Dispense Auth. Provider   amoxicillin-clavulanate (AUGMENTIN) 875-125 MG tablet Take 1 tablet by  mouth every 12 (twelve) hours. 14 tablet Ulric Salzman R, NP   predniSONE  (DELTASONE ) 20 MG tablet Take 2 tablets (40 mg total) by mouth daily. 10 tablet Nhi Butrum R, NP   benzonatate  (TESSALON ) 100 MG capsule Take 1 capsule (100 mg total) by mouth every 8 (eight) hours. 21 capsule Khira Cudmore R, NP   promethazine -dextromethorphan (PROMETHAZINE -DM) 6.25-15 MG/5ML syrup Take 5 mLs by mouth at bedtime as needed. 118 mL Wayde Gopaul, Maybelle Spatz, NP      PDMP not reviewed this encounter.   Reena Canning, NP 12/11/23 1046

## 2023-12-11 NOTE — ED Triage Notes (Signed)
 Pt present sore throat with difficulty swallowing. Pt states symptoms started last week.  Pt tried OTC medications with no relief

## 2023-12-11 NOTE — Discharge Instructions (Addendum)
 Begin Augmentin every morning and every evening for 7 days  Begin prednisone  every morning with food for 5 days, helps to open and relax the airway  May use Tessalon  pill every 8 hours as needed for cough , may use cough syrup at bedtime to allow you to sleep   You can take Tylenol   as needed for fever reduction and pain relief.   For cough: honey 1/2 to 1 teaspoon (you can dilute the honey in water or another fluid).  You can also use guaifenesin  and dextromethorphan for cough. You can use a humidifier for chest congestion and cough.  If you don't have a humidifier, you can sit in the bathroom with the hot shower running.      For sore throat: try warm salt water gargles, cepacol lozenges, throat spray, warm tea or water with lemon/honey, popsicles or ice, or OTC cold relief medicine for throat discomfort.   For congestion: take a daily anti-histamine like Zyrtec, Claritin, and a oral decongestant, such as pseudoephedrine.  You can also use Flonase 1-2 sprays in each nostril daily.   It is important to stay hydrated: drink plenty of fluids (water, gatorade/powerade/pedialyte, juices, or teas) to keep your throat moisturized and help further relieve irritation/discomfort.

## 2024-10-26 ENCOUNTER — Ambulatory Visit: Admitting: Family Medicine
